# Patient Record
Sex: Female | Born: 1990 | Race: White | Hispanic: No | Marital: Married | Smoking: Never smoker
Health system: Southern US, Community
[De-identification: ages and names within clinical notes are randomized; demographics above are authoritative.]

## PROBLEM LIST (undated history)

## (undated) DIAGNOSIS — M26629 Arthralgia of temporomandibular joint, unspecified side: Secondary | ICD-10-CM

## (undated) DIAGNOSIS — G43109 Migraine with aura, not intractable, without status migrainosus: Secondary | ICD-10-CM

## (undated) DIAGNOSIS — O009 Unspecified ectopic pregnancy without intrauterine pregnancy: Secondary | ICD-10-CM

## (undated) DIAGNOSIS — F32A Depression, unspecified: Secondary | ICD-10-CM

## (undated) DIAGNOSIS — F419 Anxiety disorder, unspecified: Secondary | ICD-10-CM

## (undated) DIAGNOSIS — F329 Major depressive disorder, single episode, unspecified: Secondary | ICD-10-CM

## (undated) HISTORY — DX: Depression, unspecified: F32.A

## (undated) HISTORY — DX: Major depressive disorder, single episode, unspecified: F32.9

## (undated) HISTORY — DX: Anxiety disorder, unspecified: F41.9

## (undated) HISTORY — DX: Arthralgia of temporomandibular joint, unspecified side: M26.629

## (undated) HISTORY — DX: Migraine with aura, not intractable, without status migrainosus: G43.109

## (undated) NOTE — *Deleted (*Deleted)
Pt stated she has been followed in office for decreasing BHCG.  On Monday is raised a little. Went to office yesterday and D&C was done. Repeat BHCG today is elevated and the biopsy of the tissue from [facility] was neg for fetal tissue. Sent to [facility] for MTX injection for probable ectopic pregnancy. Pt reports some discomfort which pat thinks is more likely from procedure yesterday.

---

## 2015-05-20 ENCOUNTER — Ambulatory Visit (INDEPENDENT_AMBULATORY_CARE_PROVIDER_SITE_OTHER): Payer: 59 | Admitting: Family Medicine

## 2015-05-20 ENCOUNTER — Ambulatory Visit (INDEPENDENT_AMBULATORY_CARE_PROVIDER_SITE_OTHER): Payer: 59

## 2015-05-20 VITALS — BP 118/80 | HR 84 | Temp 98.2°F | Resp 20 | Ht 69.0 in | Wt 282.4 lb

## 2015-05-20 DIAGNOSIS — R300 Dysuria: Secondary | ICD-10-CM

## 2015-05-20 DIAGNOSIS — B9689 Other specified bacterial agents as the cause of diseases classified elsewhere: Secondary | ICD-10-CM

## 2015-05-20 DIAGNOSIS — N760 Acute vaginitis: Secondary | ICD-10-CM | POA: Diagnosis not present

## 2015-05-20 DIAGNOSIS — R109 Unspecified abdominal pain: Secondary | ICD-10-CM | POA: Diagnosis not present

## 2015-05-20 DIAGNOSIS — R319 Hematuria, unspecified: Secondary | ICD-10-CM | POA: Diagnosis not present

## 2015-05-20 DIAGNOSIS — A499 Bacterial infection, unspecified: Secondary | ICD-10-CM

## 2015-05-20 DIAGNOSIS — R8290 Unspecified abnormal findings in urine: Secondary | ICD-10-CM | POA: Diagnosis not present

## 2015-05-20 LAB — POCT WET + KOH PREP
Trich by wet prep: ABSENT
Yeast by KOH: ABSENT
Yeast by wet prep: ABSENT

## 2015-05-20 LAB — POC MICROSCOPIC URINALYSIS (UMFC)

## 2015-05-20 LAB — POCT CBC
Granulocyte percent: 57.8 % (ref 37–80)
HCT, POC: 42.9 % (ref 37.7–47.9)
Hemoglobin: 14.6 g/dL (ref 12.2–16.2)
Lymph, poc: 4 — AB (ref 0.6–3.4)
MCH, POC: 27.1 pg (ref 27–31.2)
MCHC: 34.1 g/dL (ref 31.8–35.4)
MCV: 79.3 fL — AB (ref 80–97)
MID (cbc): 0.8 (ref 0–0.9)
MPV: 8.6 fL (ref 0–99.8)
POC Granulocyte: 6.5 (ref 2–6.9)
POC LYMPH PERCENT: 35.5 % (ref 10–50)
POC MID %: 6.7 %M (ref 0–12)
Platelet Count, POC: 272 10*3/uL (ref 142–424)
RBC: 5.41 M/uL (ref 4.04–5.48)
RDW, POC: 13.2 %
WBC: 11.3 10*3/uL — AB (ref 4.6–10.2)

## 2015-05-20 LAB — POCT URINALYSIS DIP (MANUAL ENTRY)
Bilirubin, UA: NEGATIVE
Glucose, UA: NEGATIVE
Leukocytes, UA: NEGATIVE
Nitrite, UA: NEGATIVE
Protein Ur, POC: NEGATIVE
Spec Grav, UA: 1.025
Urobilinogen, UA: 0.2
pH, UA: 5.5

## 2015-05-20 MED ORDER — METRONIDAZOLE 500 MG PO TABS: 500.0000 mg | ORAL_TABLET | Freq: Two times a day (BID) | ORAL | Status: DC

## 2015-05-20 NOTE — Progress Notes (Signed)
 Chief Complaint:  Chief Complaint  Patient presents with  . Urinary Tract Infection    HPI: Samantha Herring is a 24 y.o. female who reports to [facility] today complaining of UTI and is afraid of hsopitalization again for what sounds like pyelonephritis, doe snot feel quite like that Pain and cloudy urine and not feeling well in her bladder and flank pain.  No fevers chills, nausea, vomiting.  No bloody urine. NO vaginal discharge. No kidy stones.  She has paraguard, she got it in August 2014 . The following May she had a UTI. She has not had one since  Past Medical History  Diagnosis Date  . Anxiety   . Depression    History reviewed. No pertinent past surgical history. Social History   Social History  . Marital Status: Single    Spouse Name: N/A  . Number of Children: N/A  . Years of Education: N/A   Social History Main Topics  . Smoking status: Never Smoker   . Smokeless tobacco: None  . Alcohol Use: 1.2 oz/week    2 Standard drinks or equivalent per week  . Drug Use: No  . Sexual Activity: Not Asked   Other Topics Concern  . None   Social History Narrative  . None   Family History  Problem Relation Age of Onset  . Cancer Mother   . Heart disease Maternal Grandmother   . Hyperlipidemia Maternal Grandmother   . Stroke Maternal Grandmother   . Mental retardation Maternal Grandmother   . Cancer Maternal Grandfather   . Heart disease Paternal Grandmother   . Heart disease Paternal Grandfather   . Diabetes Paternal Grandfather    No Known Allergies Prior to Admission medications   Not on File     ROS: The patient denies fevers, chills, night sweats, unintentional weight loss, chest pain, palpitations, wheezing, dyspnea on exertion, nausea, vomiting, hematuria, melena, numbness, weakness, or tingling.   All other systems have been reviewed and were otherwise negative with the exception of those mentioned in the HPI and as above.    PHYSICAL  EXAM: Filed Vitals:   05/20/15 1717  BP: 118/80  Pulse: 84  Temp: 98.2 F (36.8 C)  Resp: 20   Body mass index is 41.68 kg/(m^2).   General: Alert, no acute distress HEENT:  Normocephalic, atraumatic, oropharynx patent. EOMI, PERRLA Cardiovascular:  Regular rate and rhythm, no rubs murmurs or gallops.  No Carotid bruits, radial pulse intact. No pedal edema.  Respiratory: Clear to auscultation bilaterally.  No wheezes, rales, or rhonchi.  No cyanosis, no use of accessory musculature Abdominal: No organomegaly, abdomen is soft and non-tender, positive bowel sounds. No masses. Skin: No rashes. Neurologic: Facial musculature symmetric. Psychiatric: Patient acts appropriately throughout our interaction. Lymphatic: No cervical or submandibular lymphadenopathy Musculoskeletal: Gait intact. No edema, tenderness Neg cmt, + IUD in place, + odor, minimal dc, no pus   LABS: Results for orders placed or performed in visit on 05/20/15  Urine culture  Result Value Ref Range   Colony Count 85,000 COLONIES/ML    Organism ID, Bacteria Multiple bacterial morphotypes present, none    Organism ID, Bacteria predominant. Suggest appropriate recollection if     Organism ID, Bacteria clinically indicated.   COMPLETE METABOLIC PANEL WITH GFR  Result Value Ref Range   Sodium 139 135 - 146 mmol/L   Potassium 3.9 3.5 - 5.3 mmol/L   Chloride 103 98 - 110 mmol/L   CO2 24 20 -  31 mmol/L   Glucose, Bld 86 65 - 99 mg/dL   BUN 10 7 - 25 mg/dL   Creat 1.610.80 0.960.50 - 0.451.10 mg/dL   Total Bilirubin 0.4 0.2 - 1.2 mg/dL   Alkaline Phosphatase 66 33 - 115 U/L   AST 18 10 - 30 U/L   ALT 19 6 - 29 U/L   Total Protein 7.0 6.1 - 8.1 g/dL   Albumin 4.2 3.6 - 5.1 g/dL   Calcium 9.2 8.6 - 40.910.2 mg/dL   GFR, Est African American >89 >=60 mL/min   GFR, Est Non African American >89 >=60 mL/min  POCT urinalysis dipstick  Result Value Ref Range   Color, UA yellow yellow   Clarity, UA hazy (A) clear   Glucose, UA  negative negative   Bilirubin, UA negative negative   Ketones, POC UA trace (5) (A) negative   Spec Grav, UA 1.025    Blood, UA trace-intact (A) negative   pH, UA 5.5    Protein Ur, POC negative negative   Urobilinogen, UA 0.2    Nitrite, UA Negative Negative   Leukocytes, UA Negative Negative  POCT Microscopic Urinalysis ([facility])  Result Value Ref Range   WBC,UR,HPF,POC Few (A) None WBC/hpf   RBC,UR,HPF,POC Few (A) None RBC/hpf   Bacteria Few (A) None, Too numerous to count   Mucus Present (A) Absent   Epithelial Cells, UR Per Microscopy Few (A) None, Too numerous to count cells/hpf  POCT CBC  Result Value Ref Range   WBC 11.3 (A) 4.6 - 10.2 K/uL   Lymph, poc 4.0 (A) 0.6 - 3.4   POC LYMPH PERCENT 35.5 10 - 50 %L   MID (cbc) 0.8 0 - 0.9   POC MID % 6.7 0 - 12 %M   POC Granulocyte 6.5 2 - 6.9   Granulocyte percent 57.8 37 - 80 %G   RBC 5.41 4.04 - 5.48 M/uL   Hemoglobin 14.6 12.2 - 16.2 g/dL   HCT, POC 81.142.9 91.437.7 - 47.9 %   MCV 79.3 (A) 80 - 97 fL   MCH, POC 27.1 27 - 31.2 pg   MCHC 34.1 31.8 - 35.4 g/dL   RDW, POC 78.213.2 %   Platelet Count, POC 272 142 - 424 K/uL   MPV 8.6 0 - 99.8 fL  POCT Wet + KOH Prep  Result Value Ref Range   Yeast by KOH Absent Present, Absent   Yeast by wet prep Absent Present, Absent   WBC by wet prep Moderate (A) None, Few, Too numerous to count   Clue Cells Wet Prep HPF POC Few (A) None, Too numerous to count   Trich by wet prep Absent Present, Absent   Bacteria Wet Prep HPF POC Moderate (A) None, Few, Too numerous to count   Epithelial Cells By [employer] Pref ([facility]) Moderate (A) None, Few, Too numerous to count   RBC,UR,HPF,POC None None RBC/hpf     EKG/XRAY:   Primary read [facility] + IUD Negative for any renal stones    ASSESSMENT/PLAN: Encounter Diagnoses  Name Primary?  . Dysuria   . Hematuria   . Flank pain   . Bacterial vaginosis Yes  . Abnormal urine    Urine cx Rx flagyl Labs pending Decline GC testing Fu  prn   Gross sideeffects, risk and benefits, and alternatives of medications d/w patient. Patient is aware that all medications have potential sideeffects and we are unable to predict every sideeffect or drug-drug interaction that  may occur.    DO  05/23/2015 10:42 AM   Urine culture results neg for anything specific, lm on phone

## 2015-05-20 NOTE — Patient Instructions (Signed)

## 2015-05-21 LAB — COMPLETE METABOLIC PANEL WITHOUT GFR
CO2: 24 mmol/L (ref 20–31)
Chloride: 103 mmol/L (ref 98–110)
GFR, Est African American: >89 mL/min (ref >=60)
GFR, Est Non African American: >89 mL/min (ref >=60)
Total Bilirubin: 0.4 mg/dL (ref 0.2–1.2)
Total Protein: 7 g/dL (ref 6.1–8.1)

## 2015-05-21 LAB — COMPLETE METABOLIC PANEL WITH GFR
ALT: 19 U/L (ref 6–29)
AST: 18 U/L (ref 10–30)
Albumin: 4.2 g/dL (ref 3.6–5.1)
Alkaline Phosphatase: 66 U/L (ref 33–115)
BUN: 10 mg/dL (ref 7–25)
Calcium: 9.2 mg/dL (ref 8.6–10.2)
Creat: 0.8 mg/dL (ref 0.50–1.10)
Glucose, Bld: 86 mg/dL (ref 65–99)
Potassium: 3.9 mmol/L (ref 3.5–5.3)
Sodium: 139 mmol/L (ref 135–146)

## 2015-05-22 LAB — URINE CULTURE: Colony Count: 85000

## 2016-05-03 ENCOUNTER — Encounter: Payer: Self-pay | Admitting: Family Medicine

## 2016-05-04 ENCOUNTER — Ambulatory Visit (INDEPENDENT_AMBULATORY_CARE_PROVIDER_SITE_OTHER): Payer: BLUE CROSS/BLUE SHIELD | Admitting: Family Medicine

## 2016-05-04 ENCOUNTER — Encounter: Payer: Self-pay | Admitting: Family Medicine

## 2016-05-04 VITALS — BP 118/70 | HR 73 | Wt 287.2 lb

## 2016-05-04 DIAGNOSIS — M26629 Arthralgia of temporomandibular joint, unspecified side: Secondary | ICD-10-CM | POA: Diagnosis not present

## 2016-05-04 DIAGNOSIS — G43109 Migraine with aura, not intractable, without status migrainosus: Secondary | ICD-10-CM

## 2016-05-04 DIAGNOSIS — Z7689 Persons encountering health services in other specified circumstances: Secondary | ICD-10-CM

## 2016-05-04 NOTE — Patient Instructions (Addendum)
Keep track of the triggers and symptoms of your headaches. I would like your medical records from your previous providers and have you follow-up regarding migraine headaches. Schedule for a physical exam and fasting labs at your convenience.

## 2016-05-04 NOTE — Progress Notes (Signed)
   Subjective:    Patient ID: Samantha Herring, female    DOB: Sep 03, 1990, 25 y.o.   MRN: [id number]  HPI Chief Complaint  Patient presents with  . new pt    new pt- get established. mentrusal migraines. no other concerns   She is new to the practice and here to establish care and for complaint of headaches. History of migraines since 6th grade. States they are more frequent recently. TMJ diagnosis in the past- has had oral surgery in the past. States she has a severe overbite. Has a dentist here, wears a nightguard.   States she has seen neurologist in the past, had MRIs.   States migraines are triggered by strong smells, sudden light changes and stress. lately notices this around the beginning of her menstrual cycles. She has been getting one per month. However, she did not have a migraine with her last menstrual cycle.  Describes aura as tunnel vision 20-30 minutes prior to headache onset. states she takes 4 Ibuprofen and lays down and it usually goes away. Occasionally has nausea. Photosensitivity but no phonophobia. Migraine lasts 1-2 days at times.   Last migraine was about a month ago.   Previous medical care: [place]  2015 Well [facility].   Last CPE: years   Other providers: sees therapist - generalized anxiety disorder.   Past medical history: UTI in 2015.   Surgeries: sister had a stroke at age 27 due to birth control. Mother with lymphoma but doing fine. HTN, cholesterol in mother and MGF and MGP.  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   Last Gynecological Exam: 2015. Last Menstrual cycle: 1 week ago. Periods are regular.  Pregnancies: 1- abortion  Reviewed allergies, medications, past medical, surgical, family, and social history.    Review of  Systems Pertinent positives and negatives in the history of present illness.     Objective:   Physical Exam BP 118/70   Pulse 73   Wt 287 lb 3.2 oz (130.3 kg)   LMP 04/29/2016   BMI 42.41 kg/m  Alert and oriented and in no acute distress. Not otherwise examined.       Assessment & Plan:  Migraine with aura and without status migrainosus, not intractable  Encounter to establish care  Arthralgia of temporomandibular joint, unspecified laterality  Discussed that she keep a headache diary and look for triggers and symptoms with her migraines and follow-up. She will sign a medical release form and I will get records from her previous neurologist and PCP. In the meantime, I recommend that she try taking 2 Aleve at onset of headache and let me know if this is not helping. May need to consider possible triptan therapy. She is not a candidate for hormonal therapy due to family history of stroke. Recommend she schedule for CPE and fasting labs in the near future. She will continue seeing her dentist for issues related to TMJ.

## 2016-05-10 ENCOUNTER — Ambulatory Visit (INDEPENDENT_AMBULATORY_CARE_PROVIDER_SITE_OTHER): Payer: BLUE CROSS/BLUE SHIELD | Admitting: Family Medicine

## 2016-05-10 ENCOUNTER — Other Ambulatory Visit (HOSPITAL_COMMUNITY)
Admission: RE | Admit: 2016-05-10 | Discharge: 2016-05-10 | Disposition: A | Discharge status: Home / Self Care | Payer: BLUE CROSS/BLUE SHIELD | Source: Ambulatory Visit | Attending: Family Medicine | Admitting: Family Medicine

## 2016-05-10 ENCOUNTER — Encounter: Payer: Self-pay | Admitting: Family Medicine

## 2016-05-10 VITALS — BP 110/80 | HR 97 | Temp 98.8°F | Resp 16 | Ht 69.0 in | Wt 283.2 lb

## 2016-05-10 DIAGNOSIS — Z113 Encounter for screening for infections with a predominantly sexual mode of transmission: Secondary | ICD-10-CM | POA: Diagnosis not present

## 2016-05-10 DIAGNOSIS — Z01419 Encounter for gynecological examination (general) (routine) without abnormal findings: Secondary | ICD-10-CM | POA: Diagnosis present

## 2016-05-10 DIAGNOSIS — Z0000 Encounter for general adult medical examination without abnormal findings: Secondary | ICD-10-CM | POA: Diagnosis not present

## 2016-05-10 DIAGNOSIS — R319 Hematuria, unspecified: Secondary | ICD-10-CM | POA: Diagnosis not present

## 2016-05-10 DIAGNOSIS — Z124 Encounter for screening for malignant neoplasm of cervix: Secondary | ICD-10-CM | POA: Diagnosis not present

## 2016-05-10 DIAGNOSIS — G43109 Migraine with aura, not intractable, without status migrainosus: Secondary | ICD-10-CM | POA: Insufficient documentation

## 2016-05-10 DIAGNOSIS — M26629 Arthralgia of temporomandibular joint, unspecified side: Secondary | ICD-10-CM | POA: Insufficient documentation

## 2016-05-10 LAB — LIPID PANEL
CHOLESTEROL: 185 mg/dL (ref 125–200)
HDL: 40 mg/dL — ABNORMAL LOW (ref >=46)
LDL Cholesterol: 123 mg/dL (ref <130)
Total CHOL/HDL Ratio: 4.6 Ratio (ref <=5.0)
Triglycerides: 108 mg/dL (ref <150)
VLDL: 22 mg/dL (ref <30)

## 2016-05-10 LAB — POCT URINALYSIS DIPSTICK
Bilirubin, UA: NEGATIVE
Glucose, UA: NEGATIVE
KETONES UA: NEGATIVE
LEUKOCYTES UA: NEGATIVE
NITRITE UA: NEGATIVE
PH UA: 5.5
PROTEIN UA: NEGATIVE
Spec Grav, UA: >=1.03
UROBILINOGEN UA: NEGATIVE

## 2016-05-10 LAB — COMPREHENSIVE METABOLIC PANEL
ALT: 20 U/L (ref 6–29)
AST: 18 U/L (ref 10–30)
Albumin: 4.2 g/dL (ref 3.6–5.1)
Alkaline Phosphatase: 66 U/L (ref 33–115)
BUN: 10 mg/dL (ref 7–25)
CHLORIDE: 107 mmol/L (ref 98–110)
CO2: 18 mmol/L — ABNORMAL LOW (ref 20–31)
Calcium: 9.1 mg/dL (ref 8.6–10.2)
Creat: 0.77 mg/dL (ref 0.50–1.10)
GLUCOSE: 82 mg/dL (ref 65–99)
Potassium: 4.3 mmol/L (ref 3.5–5.3)
SODIUM: 137 mmol/L (ref 135–146)
TOTAL PROTEIN: 7 g/dL (ref 6.1–8.1)
Total Bilirubin: 0.5 mg/dL (ref 0.2–1.2)

## 2016-05-10 LAB — CBC WITH DIFFERENTIAL/PLATELET
BASOS ABS: 86 {cells}/uL (ref 0–200)
Basophils Relative: 1 %
EOS PCT: 1 %
Eosinophils Absolute: 86 cells/uL (ref 15–500)
HCT: 40.4 % (ref 35.0–45.0)
Hemoglobin: 13.7 g/dL (ref 11.7–15.5)
LYMPHS ABS: 1892 {cells}/uL (ref 850–3900)
Lymphocytes Relative: 22 %
MCH: 27.5 pg (ref 27.0–33.0)
MCHC: 33.9 g/dL (ref 32.0–36.0)
MCV: 81 fL (ref 80.0–100.0)
MONOS PCT: 8 %
MPV: 10.5 fL (ref 7.5–12.5)
Monocytes Absolute: 688 cells/uL (ref 200–950)
NEUTROS ABS: 5848 {cells}/uL (ref 1500–7800)
Neutrophils Relative %: 68 %
PLATELETS: 314 10*3/uL (ref 140–400)
RBC: 4.99 MIL/uL (ref 3.80–5.10)
RDW: 13.7 % (ref 11.0–15.0)
WBC: 8.6 10*3/uL (ref 4.0–10.5)

## 2016-05-10 LAB — TSH: TSH: 0.95 m[IU]/L

## 2016-05-10 NOTE — Progress Notes (Signed)
Subjective:    Patient ID: Samantha Herring, female    DOB: April 21, 1991, 25 y.o.   MRN: [id number]  HPI Chief Complaint  Patient presents with  . Annual Exam    reports URI sx. pt needs PAP today. pt is fasting.    She is here for a complete physical exam.  History of migraines and last migraine was about a month ago.   Previous medical care: [place]  2015 Well woman [facility].   Last CPE: years   Other providers: sees therapist - generalized anxiety disorder.   Past medical history: UTI in 2015.   Family Hx: sister had a stroke at age 27 due to birth control. Mother with lymphoma but doing fine. HTN, cholesterol in mother and MGF and MGP.  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   Last Gynecological Exam: 2015. Last Menstrual cycle: 1 week ago. Periods are regular.  Pregnancies: 1- abortion  Diet: nothing particular Excerise: none  Immunizations: Tdap 2013, flu- refuses  Health maintenance:  Mammogram: Never Colonoscopy: never Last Gynecological Exam: last pap smear in 2015 Last Menstrual cycle: 04/29/2016 Pregnancies: 1 Last Dental Exam: twice annually  Last Eye Exam: fall 2016, glasses  Wears seatbelt always, uses sunscreen, smoke detectors in home and functioning, does not text while driving and feels safe in home environment.   Reviewed allergies, medications, past medical, surgical, family, and social history.     Review of Systems Review of Systems Constitutional: -fever, -chills, -sweats, -unexpected weight change,-fatigue ENT: -runny nose, -ear pain, -sore throat Cardiology:  -chest pain, -palpitations, -edema Respiratory: -cough, -shortness of breath, -wheezing Gastroenterology: -abdominal pain, -nausea, -vomiting, -diarrhea,  -constipation  Hematology: -bleeding or bruising problems Musculoskeletal: -arthralgias, -myalgias, -joint swelling, -back pain Ophthalmology: -vision changes Urology: -dysuria, -difficulty urinating, -hematuria, -urinary frequency, -urgency Neurology: -headache, -weakness, -tingling, -numbness       Objective:   Physical Exam BP 110/80   Pulse 97   Temp 98.8 F (37.1 C) (Oral)   Resp 16   Ht 5\' 9"  (1.753 m)   Wt 283 lb 3.2 oz (128.5 kg)   LMP 04/29/2016   SpO2 99%   BMI 41.82 kg/m   General Appearance:    Alert, cooperative, no distress, appears stated age  Head:    Normocephalic, without obvious abnormality, atraumatic  Eyes:    PERRL, conjunctiva/corneas clear, EOM's intact, fundi    benign  Ears:    Normal TM's and external ear canals  Nose:   Nares normal, mucosa normal, no drainage or sinus   tenderness  Throat:   Lips, mucosa, and tongue normal; teeth and gums normal  Neck:   Supple, no lymphadenopathy;  thyroid:  no   enlargement/tenderness/nodules; no carotid   bruit or JVD  Back:    Spine nontender, no curvature, ROM normal, no CVA     tenderness  Lungs:     Clear to auscultation bilaterally without wheezes, rales or     ronchi; respirations unlabored  Chest Wall:    No tenderness or deformity   Heart:    Regular rate and rhythm, S1 and S2 normal, no murmur, rub   or gallop  Breast Exam:    No tenderness, masses, or nipple discharge or inversion.      No axillary  lymphadenopathy  Abdomen:     Soft, non-tender, nondistended, normoactive bowel sounds,    no masses, no hepatosplenomegaly  Genitalia:    Normal external genitalia without lesions.  BUS and vagina normal; cervix without lesions, or cervical motion tenderness. No abnormal vaginal discharge.  Uterus and adnexa not enlarged, nontender, no masses.  Pap performed. Chaperone present. IUD strings visualized   Rectal:    Not performed due to age<40 and no related complaints  Extremities:   No clubbing, cyanosis  or edema  Pulses:   2+ and symmetric all extremities  Skin:   Skin color, texture, turgor normal, no rashes or lesions  Lymph nodes:   Cervical, supraclavicular, and axillary nodes normal  Neurologic:   CNII-XII intact, normal strength, sensation and gait; reflexes 2+ and symmetric throughout          Psych:   Normal mood, affect, hygiene and grooming.     Urinalysis dipstick: 2+ blood, otherwise negative      Assessment & Plan:  Routine general medical examination at a health care facility - Plan: Visual acuity screening, Urinalysis Dipstick, CBC with Differential/Platelet, Comprehensive metabolic panel, TSH, Lipid panel  Screening for cervical cancer - Plan: Cytology - PAP  Screening for STD (sexually transmitted disease) - Plan: RPR, HIV antibody  Morbid obesity (HCC) - Plan: TSH  Hematuria, unspecified type - Plan: Urine Microscopic  Discussed that her BMI places her in the morbidly obese category in the greater risk of developing chronic health conditions such as diabetes, hypertension, etc. encouraged her to use a nap such as my fitness pal to count daily calories and carbohydrates. Also encouraged her to start getting some type of physical activity each day. Overall she appears to be doing well. She does have a moderate amount of blood on her urinalysis dipstick and we'll send for microscopic evaluation. Will follow-up as needed. Pap smear performed. Discussed safety and health maintenance. She is up-to-date on tdap. Refuses flu shot. Follow-up in one year or pending labs.

## 2016-05-10 NOTE — Patient Instructions (Signed)
Return for a flu shot if you change your mind and decide to get this.  We will call you with lab results.    Preventative Care for Adults - Female      MAINTAIN REGULAR HEALTH EXAMS:  A routine yearly physical is a good way to check in with your primary care provider about your health and preventive screening. It is also an opportunity to share updates about your health and any concerns you have, and receive a thorough all-over exam.   Most health insurance companies pay for at least some preventative services.  Check with your health plan for specific coverages.  WHAT PREVENTATIVE SERVICES DO WOMEN NEED?  Adult women should have their weight and blood pressure checked regularly.   Women age 62 and older should have their cholesterol levels checked regularly.  Women should be screened for cervical cancer with a Pap smear and pelvic exam beginning at either age 47, or 3 years after they become sexually activity.    Breast cancer screening generally begins at age 36 with a mammogram and breast exam by your primary care provider.    Beginning at age 66 and continuing to age 62, women should be screened for colorectal cancer.  Certain people may need continued testing until age 89.  Updating vaccinations is part of preventative care.  Vaccinations help protect against diseases such as the flu.  Osteoporosis is a disease in which the bones lose minerals and strength as we age. Women ages 24 and over should discuss this with their caregivers, as should women after menopause who have other risk factors.  Lab tests are generally done as part of preventative care to screen for anemia and blood disorders, to screen for problems with the kidneys and liver, to screen for bladder problems, to check blood sugar, and to check your cholesterol level.  Preventative services generally include counseling about diet, exercise, avoiding tobacco, drugs, excessive alcohol consumption, and sexually transmitted  infections.    GENERAL RECOMMENDATIONS FOR GOOD HEALTH:  Healthy diet:  Eat a variety of foods, including fruit, vegetables, animal or vegetable protein, such as meat, fish, chicken, and eggs, or beans, lentils, tofu, and grains, such as rice.  Drink plenty of water daily.  Decrease saturated fat in the diet, avoid lots of red meat, processed foods, sweets, fast foods, and fried foods.  Exercise:  Aerobic exercise helps maintain good heart health. At least 30-40 minutes of moderate-intensity exercise is recommended. For example, a brisk walk that increases your heart rate and breathing. This should be done on most days of the week.   Find a type of exercise or a variety of exercises that you enjoy so that it becomes a part of your daily life.  Examples are running, walking, swimming, water aerobics, and biking.  For motivation and support, explore group exercise such as aerobic class, spin class, Zumba, Yoga,or  martial arts, etc.    Set exercise goals for yourself, such as a certain weight goal, walk or run in a race such as a 5k walk/run.  Speak to your primary care provider about exercise goals.  Disease prevention:  If you smoke or chew tobacco, find out from your caregiver how to quit. It can literally save your life, no matter how long you have been a tobacco user. If you do not use tobacco, never begin.   Maintain a healthy diet and normal weight. Increased weight leads to problems with blood pressure and diabetes.   The Body Mass  Index or BMI is a way of measuring how much of your body is fat. Having a BMI above 27 increases the risk of heart disease, diabetes, hypertension, stroke and other problems related to obesity. Your caregiver can help determine your BMI and based on it develop an exercise and dietary program to help you achieve or maintain this important measurement at a healthful level.  High blood pressure causes heart and blood vessel problems.  Persistent high blood  pressure should be treated with medicine if weight loss and exercise do not work.   Fat and cholesterol leaves deposits in your arteries that can block them. This causes heart disease and vessel disease elsewhere in your body.  If your cholesterol is found to be high, or if you have heart disease or certain other medical conditions, then you may need to have your cholesterol monitored frequently and be treated with medication.   Ask if you should have a cardiac stress test if your history suggests this. A stress test is a test done on a treadmill that looks for heart disease. This test can find disease prior to there being a problem.  Menopause can be associated with physical symptoms and risks. Hormone replacement therapy is available to decrease these. You should talk to your caregiver about whether starting or continuing to take hormones is right for you.   Osteoporosis is a disease in which the bones lose minerals and strength as we age. This can result in serious bone fractures. Risk of osteoporosis can be identified using a bone density scan. Women ages 2965 and over should discuss this with their caregivers, as should women after menopause who have other risk factors. Ask your caregiver whether you should be taking a calcium supplement and Vitamin D, to reduce the rate of osteoporosis.   Avoid drinking alcohol in excess (more than two drinks per day).  Avoid use of street drugs. Do not share needles with anyone. Ask for professional help if you need assistance or instructions on stopping the use of alcohol, cigarettes, and/or drugs.  Brush your teeth twice a day with fluoride toothpaste, and floss once a day. Good oral hygiene prevents tooth decay and gum disease. The problems can be painful, unattractive, and can cause other health problems. Visit your dentist for a routine oral and dental check up and preventive care every 6-12 months.   Look at your skin regularly.  Use a mirror to look at your  back. Notify your caregivers of changes in moles, especially if there are changes in shapes, colors, a size larger than a pencil eraser, an irregular border, or development of new moles.  Safety:  Use seatbelts 100% of the time, whether driving or as a passenger.  Use safety devices such as hearing protection if you work in environments with loud noise or significant background noise.  Use safety glasses when doing any work that could send debris in to the eyes.  Use a helmet if you ride a bike or motorcycle.  Use appropriate safety gear for contact sports.  Talk to your caregiver about gun safety.  Use sunscreen with a SPF (or skin protection factor) of 15 or greater.  Lighter skinned people are at a greater risk of skin cancer. Don't forget to also wear sunglasses in order to protect your eyes from too much damaging sunlight. Damaging sunlight can accelerate cataract formation.   Practice safe sex. Use condoms. Condoms are used for birth control and to help reduce the spread of sexually  transmitted infections (or STIs).  Some of the STIs are gonorrhea (the clap), chlamydia, syphilis, trichomonas, herpes, HPV (human papilloma virus) and HIV (human immunodeficiency virus) which causes AIDS. The herpes, HIV and HPV are viral illnesses that have no cure. These can result in disability, cancer and death.   Keep carbon monoxide and smoke detectors in your home functioning at all times. Change the batteries every 6 months or use a model that plugs into the wall.   Vaccinations:  Stay up to date with your tetanus shots and other required immunizations. You should have a booster for tetanus every 10 years. Be sure to get your flu shot every year, since 5%-20% of the [place] population comes down with the flu. The flu vaccine changes each year, so being vaccinated once is not enough. Get your shot in the fall, before the flu season peaks.   Other vaccines to consider:  Human Papilloma Virus or HPV causes  cancer of the cervix, and other infections that can be transmitted from person to person. There is a vaccine for HPV, and females should get immunized between the ages of 8611 and 5826. It requires a series of 3 shots.   Pneumococcal vaccine to protect against certain types of pneumonia.  This is normally recommended for adults age 27 or older.  However, adults younger than 27 years old with certain underlying conditions such as diabetes, heart or lung disease should also receive the vaccine.  Shingles vaccine to protect against Varicella Zoster if you are older than age 27, or younger than 27 years old with certain underlying illness.  Hepatitis A vaccine to protect against a form of infection of the liver by a virus acquired from food.  Hepatitis B vaccine to protect against a form of infection of the liver by a virus acquired from blood or body fluids, particularly if you work in health care.  If you plan to travel internationally, check with your local health department for specific vaccination recommendations.  Cancer Screening:  Breast cancer screening is essential to preventive care for women. All women age 27 and older should perform a breast self-exam every month. At age 27 and older, women should have their caregiver complete a breast exam each year. Women at ages 5640 and older should have a mammogram (x-ray film) of the breasts. Your caregiver can discuss how often you need mammograms.    Cervical cancer screening includes taking a Pap smear (sample of cells examined under a microscope) from the cervix (end of the uterus). It also includes testing for HPV (Human Papilloma Virus, which can cause cervical cancer). Screening and a pelvic exam should begin at age 27, or 3 years after a woman becomes sexually active. Screening should occur every year, with a Pap smear but no HPV testing, up to age 27. After age 27, you should have a Pap smear every 3 years with HPV testing, if no HPV was found  previously.   Most routine colon cancer screening begins at the age of 27. On a yearly basis, doctors may provide special easy to use take-home tests to check for hidden blood in the stool. Sigmoidoscopy or colonoscopy can detect the earliest forms of colon cancer and is life saving. These tests use a small camera at the end of a tube to directly examine the colon. Speak to your caregiver about this at age 27, when routine screening begins (and is repeated every 5 years unless early forms of pre-cancerous polyps or small growths are  found).

## 2016-05-11 LAB — HIV ANTIBODY (ROUTINE TESTING W REFLEX): HIV: NONREACTIVE

## 2016-05-11 LAB — RPR

## 2016-05-11 LAB — CYTOLOGY - PAP
Chlamydia: NEGATIVE
Diagnosis: NEGATIVE
Neisseria Gonorrhea: NEGATIVE

## 2016-05-11 LAB — URINALYSIS, MICROSCOPIC ONLY
BACTERIA UA: NONE SEEN [HPF]
CASTS: NONE SEEN [LPF]
Crystals: NONE SEEN [HPF]
Yeast: NONE SEEN [HPF]

## 2016-10-06 IMAGING — CR DG ABDOMEN ACUTE W/ 1V CHEST
4 series · 4 of 4 positions shown · non-contrast
Comparison: None.

CLINICAL DATA: Flank region pain and dysuria

EXAM:
DG ABDOMEN ACUTE W/ 1V CHEST

[PA]
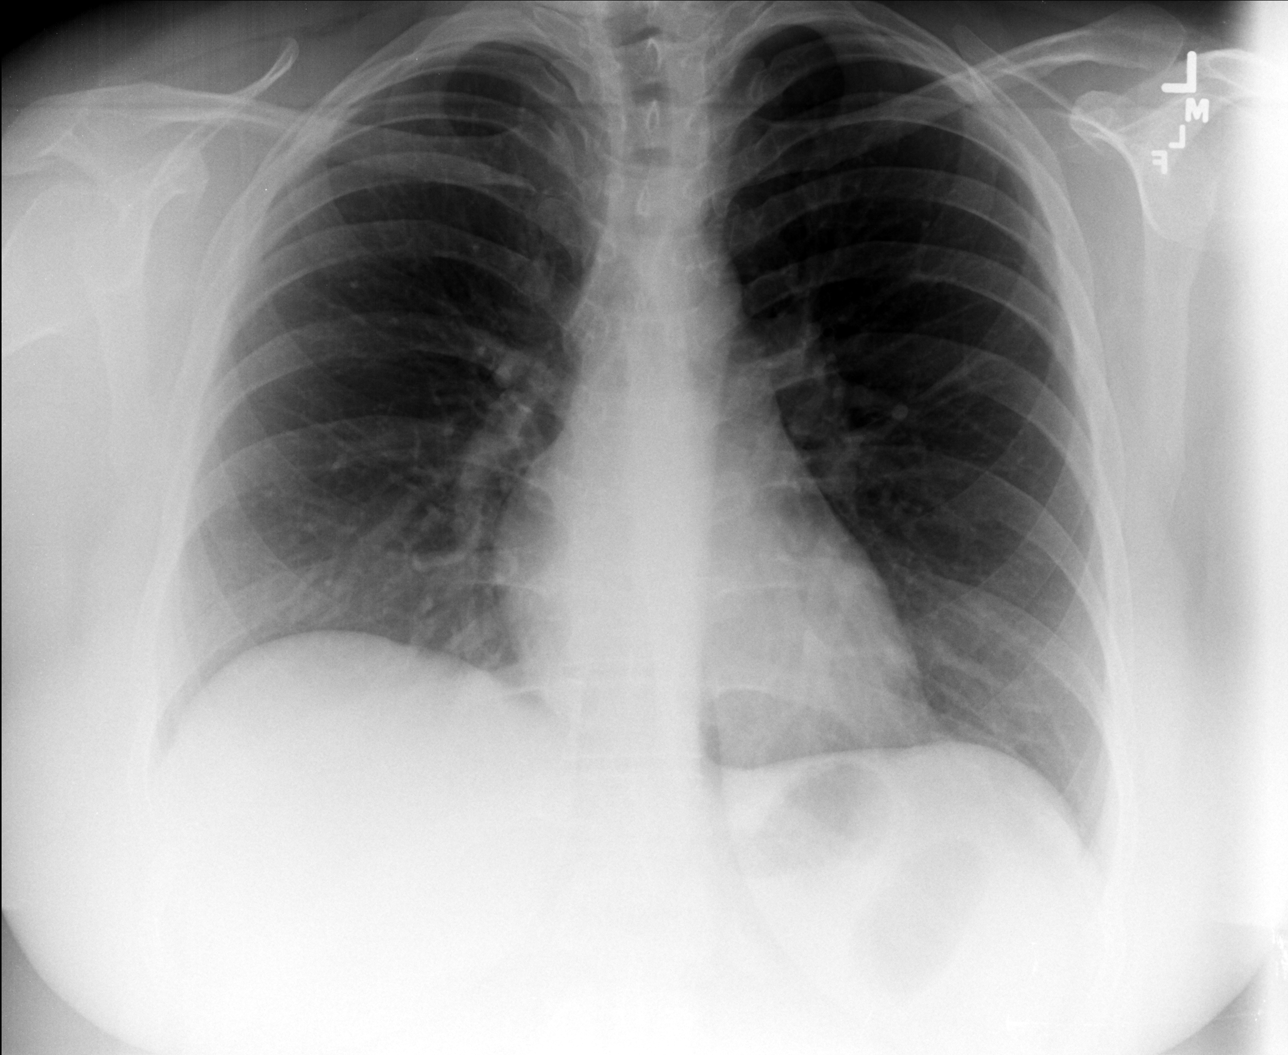

[AP (1 of 3)]
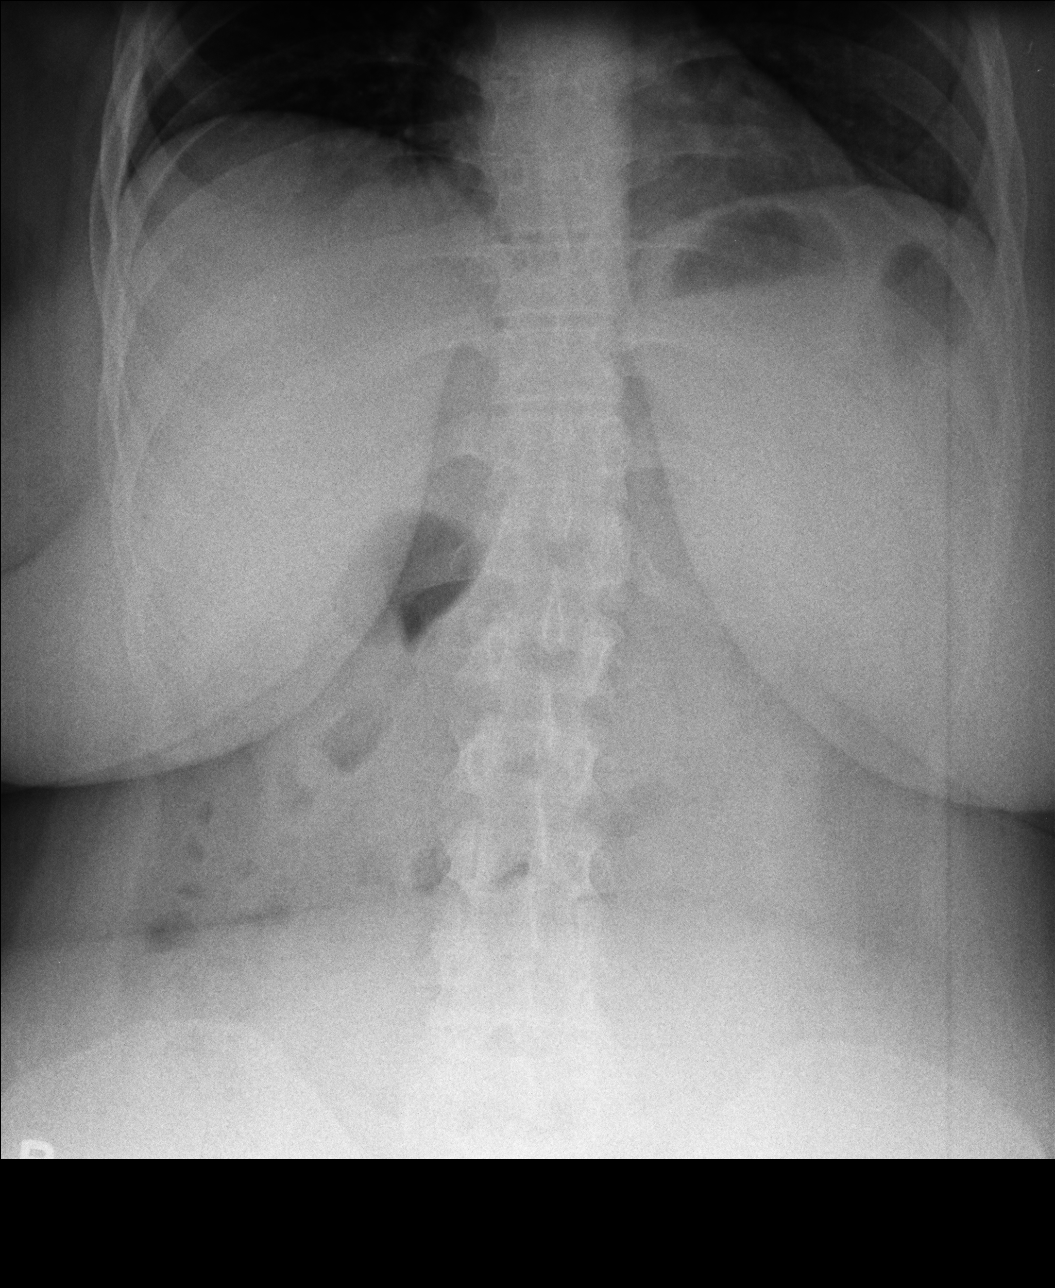

[AP (2 of 3)]
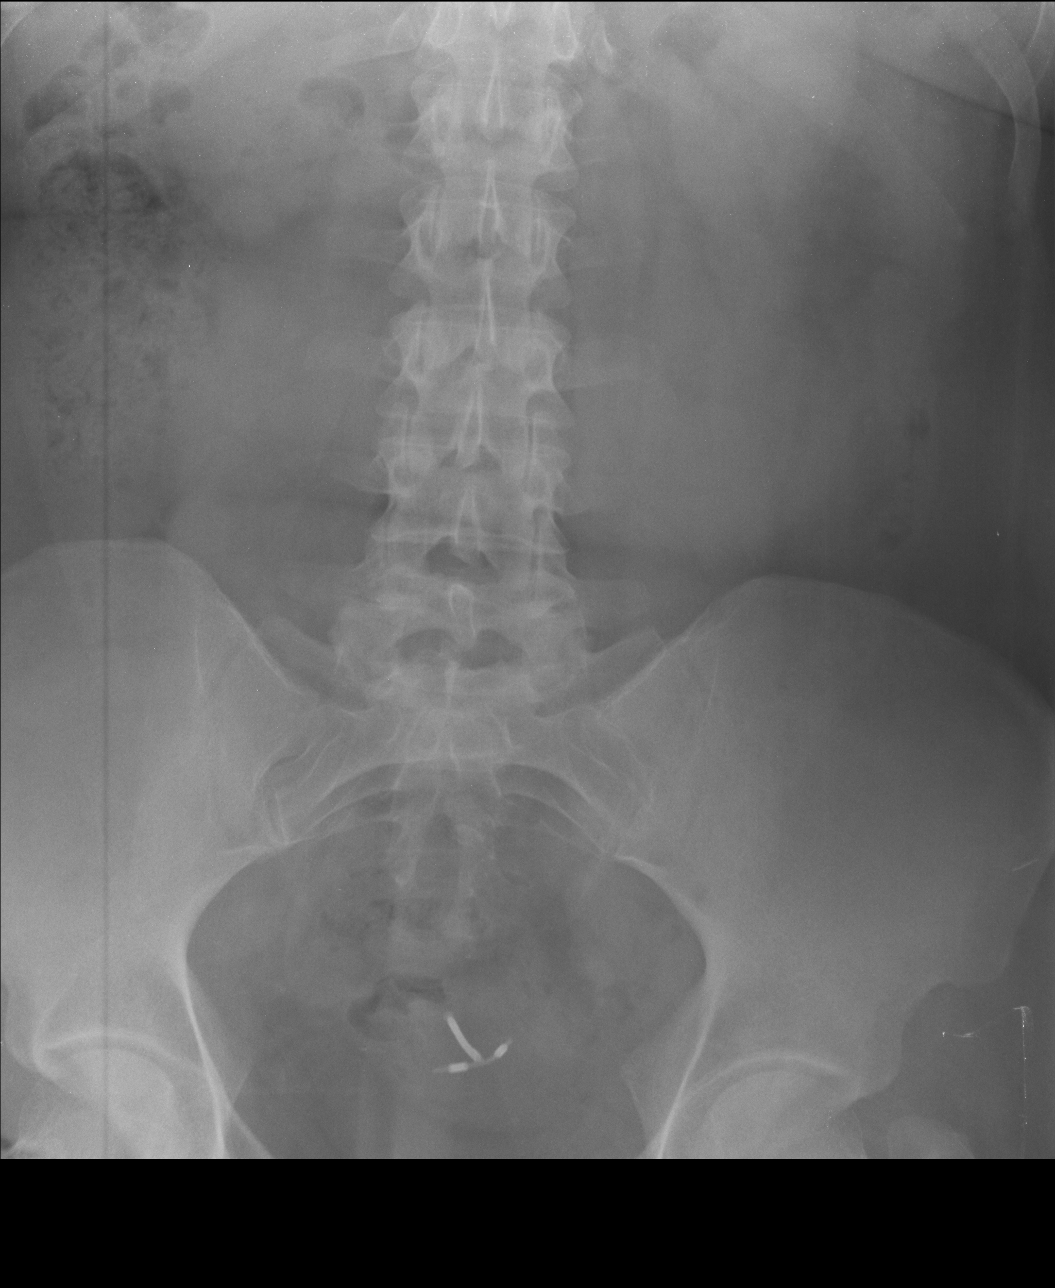

[AP (3 of 3)]
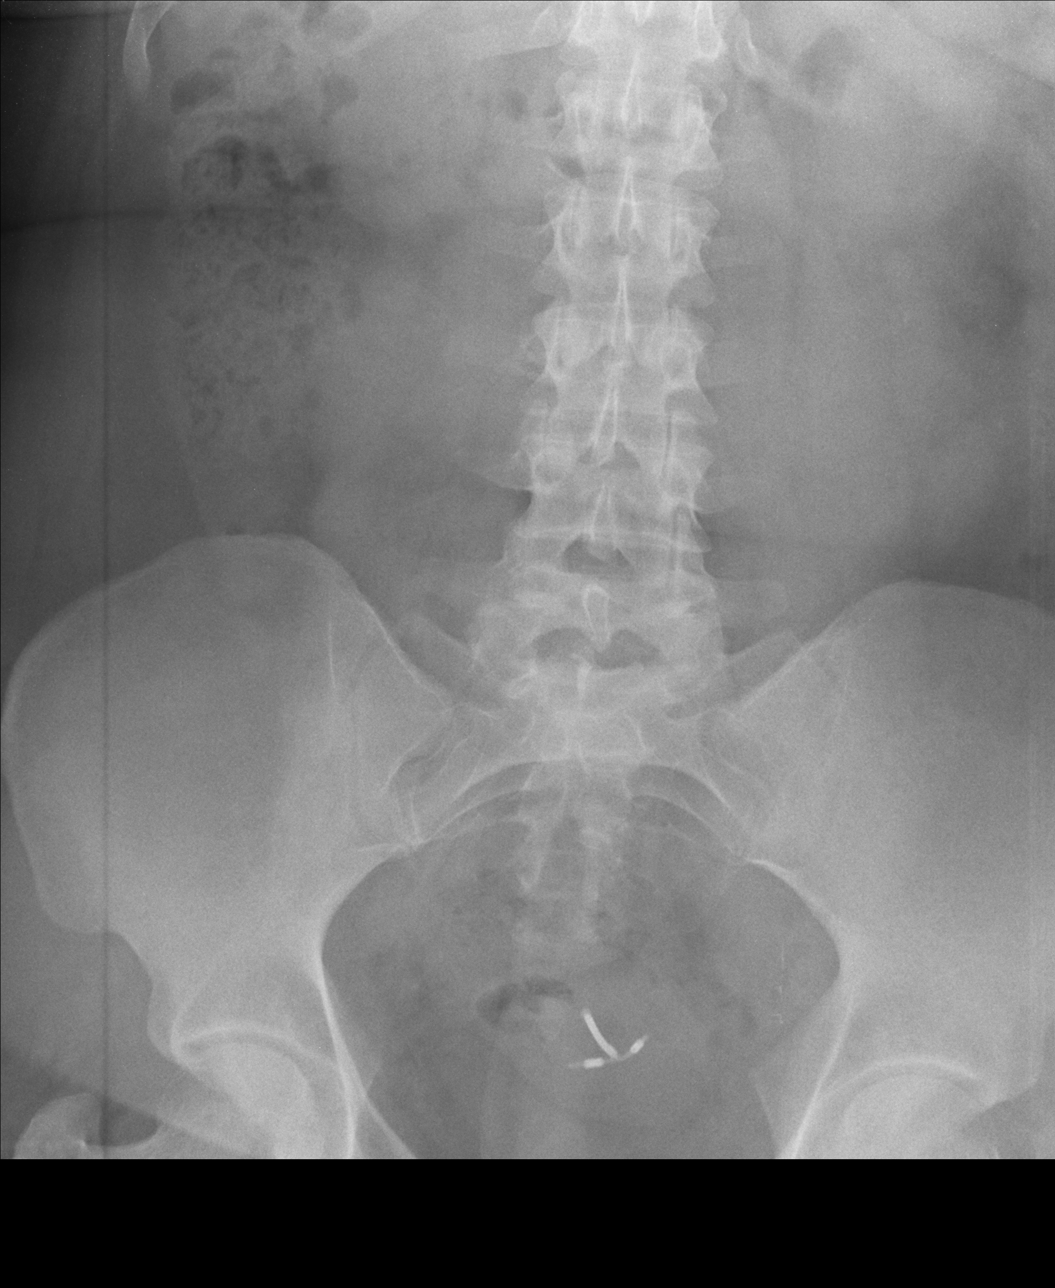

[4 of 4 positions shown; findings below may reference images not displayed]

FINDINGS: PA chest: Lungs are clear. Heart size and pulmonary vascularity are
normal. No adenopathy. No bone lesions.

Supine and upright abdomen: There is moderate stool in the colon.
There is no bowel dilatation or air-fluid level suggesting
obstruction. No free air. No abnormal calcifications are identified.
There is an intrauterine device in the mid-pelvis.
IMPRESSION: Bowel gas pattern unremarkable. No abnormal calcifications.
Intrauterine device in mid pelvis. Lungs clear.

## 2017-04-13 ENCOUNTER — Ambulatory Visit (INDEPENDENT_AMBULATORY_CARE_PROVIDER_SITE_OTHER): Payer: BC Managed Care – PPO | Admitting: Licensed Clinical Social Worker

## 2017-04-13 DIAGNOSIS — F411 Generalized anxiety disorder: Secondary | ICD-10-CM

## 2017-04-27 ENCOUNTER — Ambulatory Visit (INDEPENDENT_AMBULATORY_CARE_PROVIDER_SITE_OTHER): Payer: BC Managed Care – PPO | Admitting: Licensed Clinical Social Worker

## 2017-04-27 ENCOUNTER — Ambulatory Visit: Payer: BC Managed Care – PPO | Admitting: Licensed Clinical Social Worker

## 2017-04-27 DIAGNOSIS — F411 Generalized anxiety disorder: Secondary | ICD-10-CM | POA: Diagnosis not present

## 2017-05-11 ENCOUNTER — Ambulatory Visit (INDEPENDENT_AMBULATORY_CARE_PROVIDER_SITE_OTHER): Payer: BC Managed Care – PPO | Admitting: Licensed Clinical Social Worker

## 2017-05-11 DIAGNOSIS — F411 Generalized anxiety disorder: Secondary | ICD-10-CM

## 2017-05-24 ENCOUNTER — Ambulatory Visit: Payer: BC Managed Care – PPO | Admitting: Licensed Clinical Social Worker

## 2017-06-28 ENCOUNTER — Ambulatory Visit (INDEPENDENT_AMBULATORY_CARE_PROVIDER_SITE_OTHER): Payer: BC Managed Care – PPO | Admitting: Licensed Clinical Social Worker

## 2017-06-28 DIAGNOSIS — F411 Generalized anxiety disorder: Secondary | ICD-10-CM | POA: Diagnosis not present

## 2017-07-31 ENCOUNTER — Ambulatory Visit (INDEPENDENT_AMBULATORY_CARE_PROVIDER_SITE_OTHER): Payer: BC Managed Care – PPO | Admitting: Licensed Clinical Social Worker

## 2017-07-31 DIAGNOSIS — F411 Generalized anxiety disorder: Secondary | ICD-10-CM | POA: Diagnosis not present

## 2017-08-31 ENCOUNTER — Encounter: Payer: Self-pay | Admitting: Family Medicine

## 2017-08-31 ENCOUNTER — Ambulatory Visit: Payer: BC Managed Care – PPO | Admitting: Family Medicine

## 2017-08-31 VITALS — BP 120/80 | HR 78 | Temp 98.5°F | Wt 294.8 lb

## 2017-08-31 DIAGNOSIS — K122 Cellulitis and abscess of mouth: Secondary | ICD-10-CM | POA: Diagnosis not present

## 2017-08-31 MED ORDER — CLARITHROMYCIN 500 MG PO TABS
500.0000 mg | ORAL_TABLET | Freq: Two times a day (BID) | ORAL | 0 refills | Status: DC
Start: 2017-08-31 — End: 2017-09-11

## 2017-08-31 MED ORDER — MUPIROCIN 2 % EX OINT: TOPICAL_OINTMENT | CUTANEOUS | 0 refills | Status: DC

## 2017-08-31 NOTE — Progress Notes (Signed)
   Subjective:    Patient ID: Samantha Herring, female    DOB: May 26, 1991, 27 y.o.   MRN: [id number]  HPI Chief Complaint  Patient presents with  . cold sore    cold sore for the last week- dry lips and woke up this morning with white blisters   She is here with complaints of lip dryness, burning and white spots for the past 1-2 weeks. Denies history of cold sores.  No history of HIV, diabetes or autoimmune illnesses.  Denies fever, chills, night sweats, tongue or gum lesions or soreness. No sore throat or difficulty swallowing.   Denies new medications, toothpaste, foods.   States she has tried changing toothbrushes and lip balms.   Has copper IUD.   Reviewed allergies, medications, past medical, surgical, family, and social history.    Review of Systems Pertinent positives and negatives in the history of present illness.     Objective:   Physical Exam BP 120/80   Pulse 78   Temp 98.5 F (36.9 C) (Oral)   Wt 294 lb 12.8 oz (133.7 kg)   BMI 43.53 kg/m   Lips with erythema, small blisters, white ulceration and honey colored scabs to bilateral corners of mouth without cellulitis. Oral exam normal otherwise. No cervical adenopathy.       Assessment & Plan:  Bacterial skin infection of mouth - Plan: clarithromycin (BIAXIN) 500 MG tablet, mupirocin ointment (BACTROBAN) 2 %  She appears to have an infection suspicious for impetigo. Discussed management, prognosis and treatment. She is aware that this can be very contagious. Will treat with oral and topical antibiotics. Follow up next week.

## 2017-08-31 NOTE — Patient Instructions (Signed)
Use the Bactroban twice daily and take the oral antibiotic as prescribed.  Follow up with me next week to ensure healing.    Impetigo, Adult Impetigo is an infection of the skin. It commonly occurs in young children, but it can also occur in adults. The infection causes itchy blisters and sores that produce brownish-yellow fluid. As the fluid dries, it forms a thick, honey-colored crust. These skin changes usually occur on the face but can also affect other areas of the body. Impetigo usually goes away in 7-10 days with treatment. What are the causes? Impetigo is caused by two types of bacteria. It may be caused by staphylococci or streptococci bacteria. These bacteria cause impetigo when they get under the surface of the skin. This often happens after some damage to the skin, such as damage from:  Cuts, scrapes, or scratches.  Insect bites, especially when you scratch the area of a bite.  Chickenpox or other illnesses that cause open skin sores.  Nail biting or chewing.  Impetigo is contagious and can spread easily from one person to another. This may occur through close skin contact or by sharing towels, clothing, or other items with a person who has the infection. What increases the risk? Some things that can increase the risk of getting this infection include:  Playing sports that include skin-to-skin contact with others.  Having a skin condition with open sores.  Having many skin cuts or scrapes.  Living in an area that has high humidity levels.  Having poor hygiene.  Having high levels of staphylococci in your nose.  What are the signs or symptoms? Impetigo usually starts out as small blisters, often on the face. The blisters then break open and turn into tiny sores (lesions) with a yellow crust. In some cases, the blisters cause itching or burning. With scratching, irritation, or lack of treatment, these small lesions may get larger. Scratching can also cause impetigo to  spread to other parts of the body. The bacteria can get under the fingernails and spread when you touch another area of your skin. Other possible symptoms include:  Larger blisters.  Pus.  Swollen lymph glands.  How is this diagnosed? This condition is usually diagnosed during a physical exam. A skin sample or sample of fluid from a blister may be taken for lab tests that involve growing bacteria (culture test). This can help confirm the diagnosis or help determine the best treatment. How is this treated? Mild impetigo can be treated with prescription antibiotic cream. Oral antibiotic medicine may be used in more severe cases. Medicines for itching may also be used. Follow these instructions at home:  Take medicines only as directed by your health care provider.  To help prevent impetigo from spreading to other body areas: ? Keep your fingernails short and clean. ? Do not scratch the blisters or sores. ? Cover infected areas, if necessary, to keep from scratching.  Gently wash the infected areas with antibiotic soap and water.  Soak crusted areas in warm, soapy water using antibiotic soap. ? Gently rub the areas to remove crusts. Do not scrub.  Wash your hands often to avoid spreading this infection.  Stay home until you have used an antibiotic cream for 48 hours (2 days) or an oral antibiotic medicine for 24 hours (1 day). You should only return to work and activities with other people if your skin shows significant improvement. How is this prevented? To keep the infection from spreading:  Stay home until you  have used an antibiotic cream for 48 hours or an oral antibiotic for 24 hours.  Wash your hands often.  Do not engage in skin-to-skin contact with other people while you have still have blisters.  Do not share towels, washcloths, or bedding with others while you have the infection.  Contact a health care provider if:  You develop more blisters or sores despite  treatment.  Other family members get sores.  Your skin sores are not improving after 48 hours of treatment.  You have a fever. Get help right away if:  You see spreading redness or swelling of the skin around your sores.  You see red streaks coming from your sores.  You develop a sore throat. This information is not intended to replace advice given to you by your health care provider. Make sure you discuss any questions you have with your health care provider. Document Released: 07/18/2014 Document Revised: 12/03/2015 Document Reviewed: 06/10/2014 Elsevier Interactive Patient Education  2017 [employer].

## 2017-09-11 ENCOUNTER — Ambulatory Visit: Payer: BC Managed Care – PPO | Admitting: Family Medicine

## 2017-09-11 ENCOUNTER — Encounter: Payer: Self-pay | Admitting: Family Medicine

## 2017-09-11 VITALS — BP 120/70 | HR 64 | Temp 97.8°F | Wt 293.3 lb

## 2017-09-11 DIAGNOSIS — L089 Local infection of the skin and subcutaneous tissue, unspecified: Secondary | ICD-10-CM | POA: Diagnosis not present

## 2017-09-11 NOTE — Progress Notes (Signed)
   Subjective:    Patient ID: Samantha Herring, female    DOB: 05/06/1991, 27 y.o.   MRN: [id number]  HPI Chief Complaint  Patient presents with  . 1 week follow-up    1 week follow-up on skin. mouth is much better   She is here for follow-up on skin infection to her lips that was presumed to impetigo.  She has completed her oral antibiotic and has also been using Bactroban as prescribed.  States the area has completely cleared up and she is back to her baseline.  No other concerns or complaints.   Review of Systems Denies fever, chills, dizziness, chest pain, palpitations, shortness of breath, abdominal pain, N/V/D, urinary symptoms, LE edema.      Objective:   Physical Exam BP 120/70   Pulse 64   Temp 97.8 F (36.6 C) (Oral)   Wt 293 lb 4.8 oz (133 kg)   BMI 43.31 kg/m   Normal oral exam.  Normal facial exam.      Assessment & Plan:  Skin infection  Skin infection has cleared.  No further treatment needed.  Advised her to stop using the Bactroban.

## 2017-09-12 ENCOUNTER — Ambulatory Visit (INDEPENDENT_AMBULATORY_CARE_PROVIDER_SITE_OTHER): Payer: BC Managed Care – PPO | Admitting: Licensed Clinical Social Worker

## 2017-09-12 DIAGNOSIS — F411 Generalized anxiety disorder: Secondary | ICD-10-CM | POA: Diagnosis not present

## 2017-10-05 ENCOUNTER — Ambulatory Visit: Payer: BC Managed Care – PPO | Admitting: Family Medicine

## 2017-10-26 ENCOUNTER — Ambulatory Visit (INDEPENDENT_AMBULATORY_CARE_PROVIDER_SITE_OTHER): Payer: BC Managed Care – PPO | Admitting: Licensed Clinical Social Worker

## 2017-10-26 DIAGNOSIS — F411 Generalized anxiety disorder: Secondary | ICD-10-CM | POA: Diagnosis not present

## 2017-11-03 ENCOUNTER — Encounter: Payer: Self-pay | Admitting: Family Medicine

## 2017-11-03 ENCOUNTER — Ambulatory Visit: Payer: BC Managed Care – PPO | Admitting: Family Medicine

## 2017-11-03 VITALS — BP 136/84 | HR 78 | Temp 97.9°F | Ht 69.0 in | Wt 294.6 lb

## 2017-11-03 DIAGNOSIS — N3001 Acute cystitis with hematuria: Secondary | ICD-10-CM | POA: Diagnosis not present

## 2017-11-03 LAB — POCT URINALYSIS DIP (PROADVANTAGE DEVICE)
Bilirubin, UA: NEGATIVE
Glucose, UA: NEGATIVE mg/dL
Ketones, POC UA: NEGATIVE mg/dL
NITRITE UA: NEGATIVE
Protein Ur, POC: NEGATIVE mg/dL
SPECIFIC GRAVITY, URINE: 1.005
UUROB: 3.5
pH, UA: 6 (ref 5.0–8.0)

## 2017-11-03 MED ORDER — SULFAMETHOXAZOLE-TRIMETHOPRIM 800-160 MG PO TABS: 1.0000 | ORAL_TABLET | Freq: Two times a day (BID) | ORAL | 0 refills | Status: DC

## 2017-11-03 NOTE — Progress Notes (Signed)
   Subjective:    Patient ID: Samantha Herring, female    DOB: 1990/08/04, 27 y.o.   MRN: [id number]  HPI She complains of a one-week history of right CVA tenderness that she thinks is made worse with sitting.  She is having no frequency, urgency, dysuria.  She did note that 600 mg of ibuprofen did help.  Her next menstrual cycle should be in 1 week.  Review of Systems     Objective:   Physical Exam Alert and in no distress.  Good motion of her back.  No CVA tenderness.  Urine dipstick was positive for white cells and red blood cells Microscopic did show red blood cells and occasional white cell.      Assessment & Plan:  Acute cystitis with hematuria - Plan: sulfamethoxazole-trimethoprim (BACTRIM DS,SEPTRA DS) 800-160 MG tablet  I will treated for UTI.  Also recommend she use 600 mg of ibuprofen as needed for the pain.  She is to return here in roughly 3 weeks for recheck on her urine as her next menses is probably in 1 week.

## 2017-11-03 NOTE — Addendum Note (Signed)
Addended by: Renelda LomaHENRY, Shagun Wordell on: 11/03/2017 04:06 PM   Modules accepted: Orders

## 2017-11-06 ENCOUNTER — Ambulatory Visit: Payer: BC Managed Care – PPO | Admitting: Family Medicine

## 2017-11-13 ENCOUNTER — Telehealth: Payer: Self-pay

## 2017-11-13 NOTE — Telephone Encounter (Signed)
I recommend trying Meclizine 25 mg OTC.

## 2017-11-13 NOTE — Telephone Encounter (Signed)
Patient is going on a fishing boat and would like RX for seasickness.  [facility] [address]

## 2017-11-13 NOTE — Telephone Encounter (Signed)
Left message for pt to call me back 

## 2017-11-14 NOTE — Telephone Encounter (Signed)
Pt is leaving Friday and will be back on Tuesday. They are flying into a port and then getting on a boat. Requesting scopolamine patch 

## 2017-11-14 NOTE — Telephone Encounter (Signed)
Please send in scopolamine transdermal.  patch. Recommend staying well hydrated and not drinking alcohol if using this patch due to possible side effects.

## 2017-11-15 MED ORDER — SCOPOLAMINE 1 MG/3DAYS TD PT72: 1.0000 | MEDICATED_PATCH | TRANSDERMAL | 0 refills | Status: DC

## 2017-11-15 NOTE — Telephone Encounter (Signed)
Pt's fiancee was notified about med being sent in and staying well hydrated and not drink alcohol

## 2017-11-22 ENCOUNTER — Ambulatory Visit (INDEPENDENT_AMBULATORY_CARE_PROVIDER_SITE_OTHER): Payer: BC Managed Care – PPO | Admitting: Licensed Clinical Social Worker

## 2017-11-22 DIAGNOSIS — F411 Generalized anxiety disorder: Secondary | ICD-10-CM | POA: Diagnosis not present

## 2017-11-24 ENCOUNTER — Encounter: Payer: Self-pay | Admitting: Family Medicine

## 2017-11-24 ENCOUNTER — Ambulatory Visit: Payer: BC Managed Care – PPO | Admitting: Family Medicine

## 2017-11-24 VITALS — BP 120/80 | HR 72 | Temp 98.7°F | Ht 69.0 in | Wt 294.6 lb

## 2017-11-24 DIAGNOSIS — N3001 Acute cystitis with hematuria: Secondary | ICD-10-CM

## 2017-11-24 DIAGNOSIS — Z09 Encounter for follow-up examination after completed treatment for conditions other than malignant neoplasm: Secondary | ICD-10-CM | POA: Diagnosis not present

## 2017-11-24 DIAGNOSIS — M545 Low back pain, unspecified: Secondary | ICD-10-CM

## 2017-11-24 LAB — POCT URINALYSIS DIP (PROADVANTAGE DEVICE)
BILIRUBIN UA: NEGATIVE
BILIRUBIN UA: NEGATIVE mg/dL
GLUCOSE UA: NEGATIVE mg/dL
LEUKOCYTES UA: NEGATIVE
NITRITE UA: NEGATIVE
Protein Ur, POC: NEGATIVE mg/dL
Specific Gravity, Urine: 1.015
Urobilinogen, Ur: NEGATIVE
pH, UA: 6 (ref 5.0–8.0)

## 2017-11-24 LAB — POCT UA - MICROSCOPIC ONLY

## 2017-11-24 NOTE — Addendum Note (Signed)
Addended by: Herminio Commons A on: 11/24/2017 04:01 PM   Modules accepted: Orders

## 2017-11-24 NOTE — Addendum Note (Signed)
Addended by: Herminio Commons A on: 11/24/2017 04:58 PM   Modules accepted: Orders

## 2017-11-24 NOTE — Progress Notes (Signed)
   Subjective:    Patient ID: Samantha Herring, female    DOB: 1991/04/09, 27 y.o.   MRN: [id number]  HPI Chief Complaint  Patient presents with  . 3 week follow-up    3 week follow-up feeling better   She is here to follow-up on recent acute cystitis with hematuria.  She saw Dr. Susann Givens for this.  She was treated with Bactrim and ibuprofen.  At her visit she was also having right low back pain that occasionally radiated around to her right lower quadrant.  States that pain resolved and for the past few days she has had some mild left low back discomfort.  Pain is intermittent.  Nonradiating.  Nothing seems to worsen her pain.  Ibuprofen does help. Denies urinary frequency, urgency, dysuria or odor.   Has a copper IUD.   Denies fever, chills, chest pain, palpitations, shortness of breath, abdominal pain, N/V/D, urinary symptoms, vaginal discharge.   Reviewed allergies, medications, past medical, surgical, family, and social history.    Review of Systems Pertinent positives and negatives in the history of present illness.     Objective:   Physical Exam BP 120/80   Pulse 72   Temp 98.7 F (37.1 C) (Oral)   Ht  (1.753 m)   Wt 294 lb 9.6 oz (133.6 kg)   LMP 11/15/2017 (Exact Date)   BMI 43.50 kg/m   Alert and in no distress. Cardiac exam shows a regular sinus rhythm without murmurs or gallops. Lungs are clear to auscultation.  Abdomen is soft, nondistended, nontender.  No CVA tenderness.  Normal curvature and motion of her back.   Urinalysis dipstick 1+ blood otherwise negative. Urine microscopic shows no appreciative red blood cells.  She does have epithelial and and occasional white blood cell    Assessment & Plan:  Acute left-sided low back pain without sciatica  Acute cystitis with hematuria  Follow up  She does not appear to have a UTI at this point.  Suspect that her intermittent low back discomfort is related to musculoskeletal etiology.  Recommend taking  ibuprofen 600 to 800 mg as needed, using heat and stretching or even massage.  She will let me know if the pain is worsening or becoming more consistent or if any new symptoms arise.

## 2018-01-16 ENCOUNTER — Ambulatory Visit (INDEPENDENT_AMBULATORY_CARE_PROVIDER_SITE_OTHER): Payer: BC Managed Care – PPO | Admitting: Licensed Clinical Social Worker

## 2018-01-16 DIAGNOSIS — F411 Generalized anxiety disorder: Secondary | ICD-10-CM

## 2018-02-28 ENCOUNTER — Ambulatory Visit (INDEPENDENT_AMBULATORY_CARE_PROVIDER_SITE_OTHER): Payer: BC Managed Care – PPO | Admitting: Licensed Clinical Social Worker

## 2018-02-28 DIAGNOSIS — F411 Generalized anxiety disorder: Secondary | ICD-10-CM

## 2018-03-16 ENCOUNTER — Encounter: Payer: Self-pay | Admitting: Family Medicine

## 2018-03-16 ENCOUNTER — Ambulatory Visit: Payer: BC Managed Care – PPO | Admitting: Family Medicine

## 2018-03-16 VITALS — BP 120/78 | HR 73 | Temp 98.1°F | Resp 16 | Wt 294.6 lb

## 2018-03-16 DIAGNOSIS — R300 Dysuria: Secondary | ICD-10-CM

## 2018-03-16 DIAGNOSIS — J069 Acute upper respiratory infection, unspecified: Secondary | ICD-10-CM | POA: Diagnosis not present

## 2018-03-16 DIAGNOSIS — R319 Hematuria, unspecified: Secondary | ICD-10-CM

## 2018-03-16 DIAGNOSIS — R350 Frequency of micturition: Secondary | ICD-10-CM

## 2018-03-16 DIAGNOSIS — Z87448 Personal history of other diseases of urinary system: Secondary | ICD-10-CM | POA: Insufficient documentation

## 2018-03-16 LAB — POCT URINALYSIS DIP (PROADVANTAGE DEVICE)
Bilirubin, UA: NEGATIVE
GLUCOSE UA: NEGATIVE mg/dL
Ketones, POC UA: NEGATIVE mg/dL
LEUKOCYTES UA: NEGATIVE
NITRITE UA: NEGATIVE
PH UA: 6 (ref 5.0–8.0)
Protein Ur, POC: NEGATIVE mg/dL
Specific Gravity, Urine: 1.01
UUROB: NEGATIVE

## 2018-03-16 MED ORDER — SULFAMETHOXAZOLE-TRIMETHOPRIM 800-160 MG PO TABS: 1.0000 | ORAL_TABLET | Freq: Two times a day (BID) | ORAL | 0 refills | Status: DC

## 2018-03-16 NOTE — Progress Notes (Signed)
   Subjective:    Patient ID: Samantha Herring, female    DOB: 1991/05/08, 27 y.o.   MRN: [id number]  HPI Chief Complaint  Patient presents with  . possible UTI    pain when going to bathroom, frequent urination. going on for 2 weeks. congestion, sinus pressure and fluid in ears.    She is here with complaints of a 7 day history of urinary urgency, frequency, dysuria. Symptoms have been worsening. Low grade fever last night with chills.  No history of recurrent UTI.  Last UTI was in April.  States she recently got married and sexual activity has increased.  Also reports decreased intake of water Reports having pyelonephritis in 2015.   Also complains of a 2-3 day history rhinorrhea, sinus pressure, nasal congestion, fatigue, sore throat.   Denies dizziness, headache, ear pain, chest pain, palpitations, shortness of breath, cough, abdominal pain, back pain, vaginal discharge.  LMP: Ended 2 days ago.  Reviewed allergies, medications, past medical, surgical, family, and social history.   Review of Systems Pertinent positives and negatives in the history of present illness.     Objective:   Physical Exam BP 120/78   Pulse 73   Temp 98.1 F (36.7 C) (Oral)   Resp 16   Wt 294 lb 9.6 oz (133.6 kg)   SpO2 98%   BMI 43.50 kg/m   Alert and in no distress. No sinus tenderness. Nares with thick discharge. Tympanic membranes and canals are normal. Pharyngeal area is mildly erythematous without exudate.  Neck is supple without adenopathy or thyromegaly. Cardiac exam shows a regular sinus rhythm without murmurs or gallops. Lungs are clear to auscultation.  No CVA tenderness.  Skin is warm and dry, no pallor  Urinalysis dipstick: Large amount of blood, otherwise negative     Assessment & Plan:  Urinary frequency - Plan: sulfamethoxazole-trimethoprim (BACTRIM DS,SEPTRA DS) 800-160 MG tablet, Urine Culture  Burning with urination - Plan: POCT Urinalysis DIP (Proadvantage Device), Urine  Culture  History of pyelonephritis  Acute URI  Hematuria, unspecified type - Plan: sulfamethoxazole-trimethoprim (BACTRIM DS,SEPTRA DS) 800-160 MG tablet, Urine Culture  I am treating her for a UTI.  Bactrim prescribed.  Urine culture sent. If the urine culture is negative we discussed a referral to urology.  Also discussed a follow-up urinalysis due to hematuria and if this has not resolved she after treatment then we will refer her to urology for persistent hematuria Advised to increase water intake and to urinate after sex. Discussed that URI symptoms appear to be related to a viral etiology. Discussed supportive care and return or call if her symptoms persist or worsen.

## 2018-03-16 NOTE — Patient Instructions (Signed)
  I am treating you for a UTI and sending your urine for culture. We will forward the results.  Increase your water intake.   I suspect your URI symptoms are related to a viral illness and recommend treating your symptoms at this point.  Mucinex DM for congestion and cough, drink extra water, use salt water gargles for throat irritation and Tylenol or Ibuprofen for aches and pains.  Call if you are not improving by days 7-10 of your illness or if you develop fever, wheezing or worsening symptoms.

## 2018-03-17 LAB — URINE CULTURE

## 2018-03-19 ENCOUNTER — Other Ambulatory Visit: Payer: Self-pay | Admitting: Family Medicine

## 2018-03-19 DIAGNOSIS — R350 Frequency of micturition: Secondary | ICD-10-CM

## 2018-03-29 ENCOUNTER — Ambulatory Visit (INDEPENDENT_AMBULATORY_CARE_PROVIDER_SITE_OTHER): Payer: BC Managed Care – PPO | Admitting: Licensed Clinical Social Worker

## 2018-03-29 DIAGNOSIS — F411 Generalized anxiety disorder: Secondary | ICD-10-CM | POA: Diagnosis not present

## 2018-05-24 ENCOUNTER — Ambulatory Visit (INDEPENDENT_AMBULATORY_CARE_PROVIDER_SITE_OTHER): Payer: BC Managed Care – PPO | Admitting: Licensed Clinical Social Worker

## 2018-05-24 DIAGNOSIS — F411 Generalized anxiety disorder: Secondary | ICD-10-CM

## 2018-08-09 ENCOUNTER — Ambulatory Visit (INDEPENDENT_AMBULATORY_CARE_PROVIDER_SITE_OTHER): Payer: BC Managed Care – PPO | Admitting: Licensed Clinical Social Worker

## 2018-08-09 DIAGNOSIS — F411 Generalized anxiety disorder: Secondary | ICD-10-CM | POA: Diagnosis not present

## 2018-08-30 ENCOUNTER — Ambulatory Visit (INDEPENDENT_AMBULATORY_CARE_PROVIDER_SITE_OTHER): Payer: BC Managed Care – PPO | Admitting: Licensed Clinical Social Worker

## 2018-08-30 DIAGNOSIS — F411 Generalized anxiety disorder: Secondary | ICD-10-CM | POA: Diagnosis not present

## 2018-09-04 ENCOUNTER — Ambulatory Visit: Payer: BC Managed Care – PPO | Admitting: Licensed Clinical Social Worker

## 2018-09-26 ENCOUNTER — Ambulatory Visit (INDEPENDENT_AMBULATORY_CARE_PROVIDER_SITE_OTHER): Payer: BC Managed Care – PPO | Admitting: Licensed Clinical Social Worker

## 2018-09-26 DIAGNOSIS — F411 Generalized anxiety disorder: Secondary | ICD-10-CM

## 2018-11-07 ENCOUNTER — Ambulatory Visit (INDEPENDENT_AMBULATORY_CARE_PROVIDER_SITE_OTHER): Payer: BC Managed Care – PPO | Admitting: Licensed Clinical Social Worker

## 2018-11-07 DIAGNOSIS — F411 Generalized anxiety disorder: Secondary | ICD-10-CM | POA: Diagnosis not present

## 2018-12-04 ENCOUNTER — Ambulatory Visit: Payer: BC Managed Care – PPO | Admitting: Licensed Clinical Social Worker

## 2018-12-06 ENCOUNTER — Ambulatory Visit (INDEPENDENT_AMBULATORY_CARE_PROVIDER_SITE_OTHER): Payer: BC Managed Care – PPO | Admitting: Licensed Clinical Social Worker

## 2018-12-06 DIAGNOSIS — F411 Generalized anxiety disorder: Secondary | ICD-10-CM | POA: Diagnosis not present

## 2019-01-01 ENCOUNTER — Ambulatory Visit: Payer: Self-pay | Admitting: Licensed Clinical Social Worker

## 2019-01-09 ENCOUNTER — Ambulatory Visit (INDEPENDENT_AMBULATORY_CARE_PROVIDER_SITE_OTHER): Payer: BC Managed Care – PPO | Admitting: Licensed Clinical Social Worker

## 2019-01-09 DIAGNOSIS — F411 Generalized anxiety disorder: Secondary | ICD-10-CM

## 2019-02-06 ENCOUNTER — Ambulatory Visit (INDEPENDENT_AMBULATORY_CARE_PROVIDER_SITE_OTHER): Payer: BC Managed Care – PPO | Admitting: Licensed Clinical Social Worker

## 2019-02-06 DIAGNOSIS — F411 Generalized anxiety disorder: Secondary | ICD-10-CM

## 2019-02-11 ENCOUNTER — Other Ambulatory Visit: Payer: Self-pay

## 2019-02-11 DIAGNOSIS — Z20822 Contact with and (suspected) exposure to covid-19: Secondary | ICD-10-CM

## 2019-02-12 LAB — NOVEL CORONAVIRUS, NAA: SARS-CoV-2, NAA: NOT DETECTED

## 2019-03-07 ENCOUNTER — Ambulatory Visit (INDEPENDENT_AMBULATORY_CARE_PROVIDER_SITE_OTHER): Payer: BC Managed Care – PPO | Admitting: Licensed Clinical Social Worker

## 2019-03-07 DIAGNOSIS — F411 Generalized anxiety disorder: Secondary | ICD-10-CM | POA: Diagnosis not present

## 2019-03-22 ENCOUNTER — Other Ambulatory Visit: Payer: Self-pay

## 2019-03-22 ENCOUNTER — Other Ambulatory Visit (HOSPITAL_COMMUNITY)
Admission: RE | Admit: 2019-03-22 | Discharge: 2019-03-22 | Disposition: A | Discharge status: Home / Self Care | Payer: BC Managed Care – PPO | Source: Ambulatory Visit | Attending: Obstetrics and Gynecology | Admitting: Obstetrics and Gynecology

## 2019-03-22 ENCOUNTER — Ambulatory Visit: Payer: BC Managed Care – PPO | Admitting: Obstetrics and Gynecology

## 2019-03-22 ENCOUNTER — Encounter: Payer: Self-pay | Admitting: Obstetrics and Gynecology

## 2019-03-22 VITALS — BP 124/80 | HR 84 | Temp 97.0°F | Resp 14 | Ht 69.5 in | Wt 299.0 lb

## 2019-03-22 DIAGNOSIS — Z0000 Encounter for general adult medical examination without abnormal findings: Secondary | ICD-10-CM

## 2019-03-22 DIAGNOSIS — Z01419 Encounter for gynecological examination (general) (routine) without abnormal findings: Secondary | ICD-10-CM | POA: Diagnosis not present

## 2019-03-22 DIAGNOSIS — Z124 Encounter for screening for malignant neoplasm of cervix: Secondary | ICD-10-CM

## 2019-03-22 DIAGNOSIS — Z6841 Body Mass Index (BMI) 40.0 and over, adult: Secondary | ICD-10-CM

## 2019-03-22 DIAGNOSIS — Z3169 Encounter for other general counseling and advice on procreation: Secondary | ICD-10-CM

## 2019-03-22 NOTE — Patient Instructions (Signed)
EXERCISE AND DIET:  We recommended that you start or continue a regular exercise program for good health. Regular exercise means any activity that makes your heart beat faster and makes you sweat.  We recommend exercising at least 30 minutes per day at least 3 days a week, preferably 4 or 5.  We also recommend a diet low in fat and sugar.  Inactivity, poor dietary choices and obesity can cause diabetes, heart attack, stroke, and kidney damage, among others.    ALCOHOL AND SMOKING:  Women should limit their alcohol intake to no more than 7 drinks/beers/glasses of wine (combined, not each!) per week. Moderation of alcohol intake to this level decreases your risk of breast cancer and liver damage. And of course, no recreational drugs are part of a healthy lifestyle.  And absolutely no smoking or even second hand smoke. Most people know smoking can cause heart and lung diseases, but did you know it also contributes to weakening of your bones? Aging of your skin?  Yellowing of your teeth and nails?  CALCIUM AND VITAMIN D:  Adequate intake of calcium and Vitamin D are recommended.  The recommendations for exact amounts of these supplements seem to change often, but generally speaking 1,000 mg of calcium (between diet and supplement) and 800 units of Vitamin D per day seems prudent. Certain women may benefit from higher intake of Vitamin D.  If you are among these women, your doctor will have told you during your visit.    PAP SMEARS:  Pap smears, to check for cervical cancer or precancers,  have traditionally been done yearly, although recent scientific advances have shown that most women can have pap smears less often.  However, every woman still should have a physical exam from her gynecologist every year. It will include a breast check, inspection of the vulva and vagina to check for abnormal growths or skin changes, a visual exam of the cervix, and then an exam to evaluate the size and shape of the uterus and  ovaries.  And after 30 years of age, a rectal exam is indicated to check for rectal cancers. We will also provide age appropriate advice regarding health maintenance, like when you should have certain vaccines, screening for sexually transmitted diseases, bone density testing, colonoscopy, mammograms, etc.   MAMMOGRAMS:  All women over 40 years old should have a yearly mammogram. Many facilities now offer a "3D" mammogram, which may cost around $50 extra out of pocket. If possible,  we recommend you accept the option to have the 3D mammogram performed.  It both reduces the number of women who will be called back for extra views which then turn out to be normal, and it is better than the routine mammogram at detecting truly abnormal areas.    COLON CANCER SCREENING: Now recommend starting at age 45. At this time colonoscopy is not covered for routine screening until 50. There are take home tests that can be done between 45-49.   COLONOSCOPY:  Colonoscopy to screen for colon cancer is recommended for all women at age 50.  We know, you hate the idea of the prep.  We agree, BUT, having colon cancer and not knowing it is worse!!  Colon cancer so often starts as a polyp that can be seen and removed at colonscopy, which can quite literally save your life!  And if your first colonoscopy is normal and you have no family history of colon cancer, most women don't have to have it again for   10 years.  Once every ten years, you can do something that may end up saving your life, right?  We will be happy to help you get it scheduled when you are ready.  Be sure to check your insurance coverage so you understand how much it will cost.  It may be covered as a preventative service at no cost, but you should check your particular policy.      Breast Self-Awareness Breast self-awareness means being familiar with how your breasts look and feel. It involves checking your breasts regularly and reporting any changes to your  health care provider. Practicing breast self-awareness is important. A change in your breasts can be a sign of a serious medical problem. Being familiar with how your breasts look and feel allows you to find any problems early, when treatment is more likely to be successful. All women should practice breast self-awareness, including women who have had breast implants. How to do a breast self-exam One way to learn what is normal for your breasts and whether your breasts are changing is to do a breast self-exam. To do a breast self-exam: Look for Changes  1. Remove all the clothing above your waist. 2. Stand in front of a mirror in a room with good lighting. 3. Put your hands on your hips. 4. Push your hands firmly downward. 5. Compare your breasts in the mirror. Look for differences between them (asymmetry), such as: ? Differences in shape. ? Differences in size. ? Puckers, dips, and bumps in one breast and not the other. 6. Look at each breast for changes in your skin, such as: ? Redness. ? Scaly areas. 7. Look for changes in your nipples, such as: ? Discharge. ? Bleeding. ? Dimpling. ? Redness. ? A change in position. Feel for Changes Carefully feel your breasts for lumps and changes. It is best to do this while lying on your back on the floor and again while sitting or standing in the shower or tub with soapy water on your skin. Feel each breast in the following way:  Place the arm on the side of the breast you are examining above your head.  Feel your breast with the other hand.  Start in the nipple area and make  inch (2 cm) overlapping circles to feel your breast. Use the pads of your three middle fingers to do this. Apply light pressure, then medium pressure, then firm pressure. The light pressure will allow you to feel the tissue closest to the skin. The medium pressure will allow you to feel the tissue that is a little deeper. The firm pressure will allow you to feel the tissue  close to the ribs.  Continue the overlapping circles, moving downward over the breast until you feel your ribs below your breast.  Move one finger-width toward the center of the body. Continue to use the  inch (2 cm) overlapping circles to feel your breast as you move slowly up toward your collarbone.  Continue the up and down exam using all three pressures until you reach your armpit.  Write Down What You Find  Write down what is normal for each breast and any changes that you find. Keep a written record with breast changes or normal findings for each breast. By writing this information down, you do not need to depend only on memory for size, tenderness, or location. Write down where you are in your menstrual cycle, if you are still menstruating. If you are having trouble noticing differences   in your breasts, do not get discouraged. With time you will become more familiar with the variations in your breasts and more comfortable with the exam. How often should I examine my breasts? Examine your breasts every month. If you are breastfeeding, the best time to examine your breasts is after a feeding or after using a breast pump. If you menstruate, the best time to examine your breasts is 5-7 days after your period is over. During your period, your breasts are lumpier, and it may be more difficult to notice changes. When should I see my health care provider? See your health care provider if you notice:  A change in shape or size of your breasts or nipples.  A change in the skin of your breast or nipples, such as a reddened or scaly area.  Unusual discharge from your nipples.  A lump or thick area that was not there before.  Pain in your breasts.  Anything that concerns you. pre Eating Plan for Pregnant Women While you are pregnant, your body requires additional nutrition to help support your growing baby. You also have a higher need for some vitamins and minerals, such as folic acid,  calcium, iron, and vitamin D. Eating a healthy, well-balanced diet is very important for your health and your baby's health. Your need for extra calories varies for the three 37-month segments of your pregnancy (trimesters). For most women, it is recommended to consume:  150 extra calories a day during the first trimester.  300 extra calories a day during the second trimester.  300 extra calories a day during the third trimester. What are tips for following this plan?   Do not try to lose weight or go on a diet during pregnancy.  Limit your overall intake of foods that have "empty calories." These are foods that have little nutritional value, such as sweets, desserts, candies, and sugar-sweetened beverages.  Eat a variety of foods (especially fruits and vegetables) to get a full range of vitamins and minerals.  Take a prenatal vitamin to help meet your additional vitamin and mineral needs during pregnancy, specifically for folic acid, iron, calcium, and vitamin D.  Remember to stay active. Ask your health care provider what types of exercise and activities are safe for you.  Practice good food safety and cleanliness. Wash your hands before you eat and after you prepare raw meat. Wash all fruits and vegetables well before peeling or eating. Taking these actions can help to prevent food-borne illnesses that can be very dangerous to your baby, such as listeriosis. Ask your health care provider for more information about listeriosis. What does 150 extra calories look like? Healthy options that provide 150 extra calories each day could be any of the following:  6-8 oz (170-230 g) of plain low-fat yogurt with  cup of berries.  1 apple with 2 teaspoons (11 g) of peanut butter.  Cut-up vegetables with  cup (60 g) of hummus.  8 oz (230 mL) or 1 cup of low-fat chocolate milk.  1 stick of string cheese with 1 medium orange.  1 peanut butter and jelly sandwich that is made with one slice of  whole-wheat bread and 1 tsp (5 g) of peanut butter. For 300 extra calories, you could eat two of those healthy options each day. What is a healthy amount of weight to gain? The right amount of weight gain for you is based on your BMI before you became pregnant. If your BMI:  Was less than 18 (  underweight), you should gain 28-40 lb (13-18 kg).  Was 18-24.9 (normal), you should gain 25-35 lb (11-16 kg).  Was 25-29.9 (overweight), you should gain 15-25 lb (7-11 kg).  Was 30 or greater (obese), you should gain 11-20 lb (5-9 kg). What if I am having twins or multiples? Generally, if you are carrying twins or multiples:  You may need to eat 300-600 extra calories a day.  The recommended range for total weight gain is 25-54 lb (11-25 kg), depending on your BMI before pregnancy.  Talk with your health care provider to find out about nutritional needs, weight gain, and exercise that is right for you. What foods can I eat?  Grains All grains. Choose whole grains, such as whole-wheat bread, oatmeal, or brown rice. Vegetables All vegetables. Eat a variety of colors and types of vegetables. Remember to wash your vegetables well before peeling or eating. Fruits All fruits. Eat a variety of colors and types of fruit. Remember to wash your fruits well before peeling or eating. Meats and other protein foods Lean meats, including chicken, [place], fish, and lean cuts of beef, veal, or pork. If you eat fish or seafood, choose options that are higher in omega-3 fatty acids and lower in mercury, such as salmon, herring, mussels, trout, sardines, pollock, shrimp, crab, and lobster. Tofu. Tempeh. Beans. Eggs. Peanut butter and other nut butters. Make sure that all meats, poultry, and eggs are cooked to food-safe temperatures or "well-done." Two or more servings of fish are recommended each week in order to get the most benefits from omega-3 fatty acids that are found in seafood. Choose fish that are lower in  mercury. You can find more information online:  PumpkinSearch.com.eewww.fda.gov Dairy Pasteurized milk and milk alternatives (such as almond milk). Pasteurized yogurt and pasteurized cheese. Cottage cheese. Sour cream. Beverages Water. Juices that contain 100% fruit juice or vegetable juice. Caffeine-free teas and decaffeinated coffee. Drinks that contain caffeine are okay to drink, but it is better to avoid caffeine. Keep your total caffeine intake to less than 200 mg each day (which is 12 oz or 355 mL of coffee, tea, or soda) or the limit as told by your health care provider. Fats and oils Fats and oils are okay to include in moderation. Sweets and desserts Sweets and desserts are okay to include in moderation. Seasoning and other foods All pasteurized condiments. The items listed above may not be a complete list of recommended foods and beverages. Contact your dietitian for more options. The items listed above may not be a complete list of foods and beverages [you/your child] can eat. Contact a dietitian for more information. What foods are not recommended? Vegetables Raw (unpasteurized) vegetable juices. Fruits Unpasteurized fruit juices. Meats and other protein foods Lunch meats, bologna, hot dogs, or other deli meats. (If you must eat those meats, reheat them until they are steaming hot.) Refrigerated pat, meat spreads from a meat counter, smoked seafood that is found in the refrigerated section of a store. Raw or undercooked meats, poultry, and eggs. Raw fish, such as sushi or sashimi. Fish that have high mercury content, such as tilefish, shark, swordfish, and king mackerel. To learn more about mercury in fish, talk with your health care provider or look for online resources, such as:  PumpkinSearch.com.eewww.fda.gov Dairy Raw (unpasteurized) milk and any foods that have raw milk in them. Soft cheeses, such as feta, queso blanco, queso fresco, Brie, Camembert cheeses, blue-veined cheeses, and Panela cheese (unless it is  made with pasteurized  milk, which must be stated on the label). Beverages Alcohol. Sugar-sweetened beverages, such as sodas, teas, or energy drinks. Seasoning and other foods Homemade fermented foods and drinks, such as pickles, sauerkraut, or kombucha drinks. (Store-bought pasteurized versions of these are okay.) Salads that are made in a store or deli, such as ham salad, chicken salad, egg salad, tuna salad, and seafood salad. The items listed above may not be a complete list of foods and beverages to avoid. Contact your dietitian for more information. The items listed above may not be a complete list of foods and beverages [you/your child] should avoid. Contact a dietitian for more information. Where to find more information To calculate the number of calories you need based on your height, weight, and activity level, you can use an online calculator such as:  PackageNews.is To calculate how much weight you should gain during pregnancy, you can use an online pregnancy weight gain calculator such as:  http://jones-berg.com/ Summary  While you are pregnant, your body requires additional nutrition to help support your growing baby.  Eat a variety of foods, especially fruits and vegetables to get a full range of vitamins and minerals.  Practice good food safety and cleanliness. Wash your hands before you eat and after you prepare raw meat. Wash all fruits and vegetables well before peeling or eating. Taking these actions can help to prevent food-borne illnesses, such as listeriosis, that can be very dangerous to your baby.  Do not eat raw meat or fish. Do not eat fish that have high mercury content, such as tilefish, shark, swordfish, and king mackerel. Do not eat unpasteurized (raw) dairy.  Take a prenatal vitamin to help meet your additional vitamin and mineral needs during pregnancy, specifically for folic acid, iron, calcium, and vitamin  D. This information is not intended to replace advice given to you by your health care provider. Make sure you discuss any questions you have with your health care provider. Document Released: 04/11/2014 Document Revised: 10/18/2018 Document Reviewed: 03/24/2017 Elsevier Patient Education  2020 [employer].  Preparing for Pregnancy If you are considering becoming pregnant, make an appointment to see your regular health care provider to learn how to prepare for a safe and healthy pregnancy (preconception care). During a preconception care visit, your health care provider will:  Do a complete physical exam, including a Pap test.  Take a complete medical history.  Give you information, answer your questions, and help you resolve problems. Preconception checklist Medical history  Tell your health care provider about any current or past medical conditions. Your pregnancy or your ability to become pregnant may be affected by chronic conditions, such as diabetes, chronic hypertension, and thyroid problems.  Include your family's medical history as well as your partner's medical history.  Tell your health care provider about any history of STIs (sexually transmitted infections).These can affect your pregnancy. In some cases, they can be passed to your baby. Discuss any concerns that you have about STIs.  If indicated, discuss the benefits of genetic testing. This testing will show whether there are any genetic conditions that may be passed from you or your partner to your baby.  Tell your health care provider about: ? Any problems you have had with conception or pregnancy. ? Any medicines you take. These include vitamins, herbal supplements, and over-the-counter medicines. ? Your history of immunizations. Discuss any vaccinations that you may need. Diet  Ask your health care provider what to include in a healthy diet that has a  balance of nutrients. This is especially important when you are  pregnant or preparing to become pregnant.  Ask your health care provider to help you reach a healthy weight before pregnancy. ? If you are overweight, you may be at higher risk for certain complications, such as high blood pressure, diabetes, and preterm birth. ? If you are underweight, you are more likely to have a baby who has a low birth weight. Lifestyle, work, and home  Let your health care provider know: ? About any lifestyle habits that you have, such as alcohol use, drug use, or smoking. ? About recreational activities that may put you at risk during pregnancy, such as downhill skiing and certain exercise programs. ? Tell your health care provider about any international travel, especially any travel to places with an active [place] virus outbreak. ? About harmful substances that you may be exposed to at work or at home. These include chemicals, pesticides, radiation, or even litter boxes. ? If you do not feel safe at home. Mental health  Tell your health care provider about: ? Any history of mental health conditions, including feelings of depression, sadness, or anxiety. ? Any medicines that you take for a mental health condition. These include herbs and supplements. Home instructions to prepare for pregnancy Lifestyle   Eat a balanced diet. This includes fresh fruits and vegetables, whole grains, lean meats, low-fat dairy products, healthy fats, and foods that are high in fiber. Ask to meet with a nutritionist or registered dietitian for assistance with meal planning and goals.  Get regular exercise. Try to be active for at least 30 minutes a day on most days of the week. Ask your health care provider which activities are safe during pregnancy.  Do not use any products that contain nicotine or tobacco, such as cigarettes and e-cigarettes. If you need help quitting, ask your health care provider.  Do not drink alcohol.  Do not take illegal drugs.  Maintain a healthy weight. Ask  your health care provider what weight range is right for you. General instructions  Keep an accurate record of your menstrual periods. This makes it easier for your health care provider to determine your baby's due date.  Begin taking prenatal vitamins and folic acid supplements daily as directed by your health care provider.  Manage any chronic conditions, such as high blood pressure and diabetes, as told by your health care provider. This is important. How do I know that I am pregnant? You may be pregnant if you have been sexually active and you miss your period. Symptoms of early pregnancy include:  Mild cramping.  Very light vaginal bleeding (spotting).  Feeling unusually tired.  Nausea and vomiting (morning sickness). If you have any of these symptoms and you suspect that you might be pregnant, you can take a home pregnancy test. These tests check for a hormone in your urine (human chorionic gonadotropin, or hCG). A woman's body begins to make this hormone during early pregnancy. These tests are very accurate. Wait until at least the first day after you miss your period to take one. If the test shows that you are pregnant (you get a positive result), call your health care provider to make an appointment for prenatal care. What should I do if I become pregnant?      Make an appointment with your health care provider as soon as you suspect you are pregnant.  Do not use any products that contain nicotine, such as cigarettes, chewing tobacco, and  e-cigarettes. If you need help quitting, ask your health care provider.  Do not drink alcoholic beverages. Alcohol is related to a number of birth defects.  Avoid toxic odors and chemicals.  You may continue to have sexual intercourse if it does not cause pain or other problems, such as vaginal bleeding. This information is not intended to replace advice given to you by your health care provider. Make sure you discuss any questions you have  with your health care provider. Document Released: 06/09/2008 Document Revised: 06/29/2017 Document Reviewed: 01/17/2016 Elsevier Patient Education  2020 [employer].

## 2019-03-22 NOTE — Progress Notes (Signed)
28 y.o. G37P0010 Single White or Caucasian Not Hispanic or Latino female here as a new patient for preconception counseling. Patient is also due for annual exam. Last Pap smear done with PCP about 2 years ago per patient. Patient has Paragard IUD that was inserted in 2015. She is planning to try to get pregnant in January.  Sexually active, no pain.  Period Cycle (Days): 28 Period Duration (Days): 4-5 Period Pattern: Regular Menstrual Flow: Moderate Menstrual Control: (Menstrual cup) Menstrual Control Change Freq (Hours): 8 Dysmenorrhea: (!) Moderate Dysmenorrhea Symptoms: Cramping(bloating, migraines)   No family history of chromosomal abnormalities. Husband is healthy.   Patient's last menstrual period was 02/22/2019.          Sexually active: Yes.    The current method of family planning is Paragard IUD.    Exercising: Yes.    stationary bike Smoker:  no  Health Maintenance: Pap:  05/10/16 Negative History of abnormal Pap:  no MMG:  n/a BMD:   n/a Colonoscopy: n/a TDaP:  November 2019 Gardasil: No   reports that she has never smoked. She has never used smokeless tobacco. She reports current alcohol use of about 1.0 standard drinks of alcohol per week. She reports that she does not use drugs. She is an Systems developer at [employer].   Past Medical History:  Diagnosis Date  . Anxiety   . Depression   . Migraine headache with aura    since 6th grade  . TMJ arthralgia   Doing okay from depression/anxiety standpoint. Not on medication currently. See's a therapist one x a month.   History reviewed. No pertinent surgical history.  Current Outpatient Medications  Medication Sig Dispense Refill  . PARAGARD INTRAUTERINE COPPER IU by Intrauterine route.     No current facility-administered medications for this visit.     Family History  Problem Relation Age of Onset  . Cancer Mother        lymphoma  . Hypertension Mother   . Stroke Sister 37        estrogen related  .  Heart disease Maternal Grandmother   . Hyperlipidemia Maternal Grandmother   . Stroke Maternal Grandmother   . Alzheimer's disease Maternal Grandmother   . Cancer Maternal Grandfather   . Multiple myeloma Maternal Grandfather   . Heart disease Paternal Grandmother   . Heart disease Paternal Grandfather   Sister had a stroke at 66 on OCP's, fine now.   Review of Systems  Constitutional: Negative.   HENT: Negative.   Eyes: Negative.   Respiratory: Negative.   Cardiovascular: Negative.   Gastrointestinal: Negative.   Endocrine: Negative.   Genitourinary: Negative.   Musculoskeletal: Negative.   Skin: Negative.   Allergic/Immunologic: Negative.   Neurological: Negative.   Hematological: Negative.   Psychiatric/Behavioral: Negative.     Exam:   BP 124/80 (BP Location: Right Arm, Patient Position: Sitting, Cuff Size: Large)   Pulse 84   Temp (!) 97 F (36.1 C) (Temporal)   Resp 14   Ht 5' 9.5" (1.765 m)   Wt 299 lb (135.6 kg)   LMP 02/22/2019   BMI 43.52 kg/m   Weight change: @WEIGHTCHANGE @ Height:   Height: 5' 9.5" (176.5 cm)  Ht Readings from Last 3 Encounters:  03/22/19 5' 9.5" (1.765 m)  11/24/17 5' 9"  (1.753 m)  11/03/17 5' 9"  (1.753 m)    General appearance: alert, cooperative and appears stated age Head: Normocephalic, without obvious abnormality, atraumatic Neck: no adenopathy, supple, symmetrical, trachea midline and thyroid  normal to inspection and palpation Lungs: clear to auscultation bilaterally Cardiovascular: regular rate and rhythm Breasts: normal appearance, no masses or tenderness Abdomen: soft, non-tender; non distended,  no masses,  no organomegaly Extremities: extremities normal, atraumatic, no cyanosis or edema Skin: Skin color, texture, turgor normal. No rashes or lesions Lymph nodes: Cervical, supraclavicular, and axillary nodes normal. No abnormal inguinal nodes palpated Neurologic: Grossly normal   Pelvic: External genitalia:  no lesions               Urethra:  normal appearing urethra with no masses, tenderness or lesions              Bartholins and Skenes: normal                 Vagina: normal appearing vagina with normal color and discharge, no lesions              Cervix: no lesions and IUD string 4 cm               Bimanual Exam:  Uterus:  exam limited with BMI, no masses or tenderness              Adnexa: no mass, fullness, tenderness               Rectovaginal: Confirms               Anus:  normal sphincter tone, no lesions  Chaperone was present for exam.  A:  Well Woman with normal exam  BMI 43, she is trying to loose weight (down 6 lbs in 2-3 weeks)  IUD check, she will return for removal when ready to get pregnant.   P:   Pap with hpv  Screening labs  Start PNV  She has had chicken pox  Rubella screen  Discussed weight loss  Discussed risks of gestational DM, HTN, LGA with elevated BMI  Discussed good health prior to pregnancy, information given  Discussed breast self exam  Discussed calcium and vit D intake

## 2019-03-23 LAB — COMPREHENSIVE METABOLIC PANEL
ALT: 28 IU/L (ref 0–32)
AST: 23 IU/L (ref 0–40)
Albumin/Globulin Ratio: 1.8 (ref 1.2–2.2)
Albumin: 4.4 g/dL (ref 3.9–5.0)
Alkaline Phosphatase: 87 IU/L (ref 39–117)
BUN/Creatinine Ratio: 15 (ref 9–23)
BUN: 12 mg/dL (ref 6–20)
Bilirubin Total: 0.5 mg/dL (ref 0.0–1.2)
CO2: 21 mmol/L (ref 20–29)
Calcium: 9.2 mg/dL (ref 8.7–10.2)
Chloride: 104 mmol/L (ref 96–106)
Creatinine, Ser: 0.81 mg/dL (ref 0.57–1.00)
GFR calc Af Amer: 113 mL/min/{1.73_m2} (ref >59)
GFR calc non Af Amer: 98 mL/min/{1.73_m2} (ref >59)
Globulin, Total: 2.5 g/dL (ref 1.5–4.5)
Glucose: 77 mg/dL (ref 65–99)
Potassium: 4.4 mmol/L (ref 3.5–5.2)
Sodium: 140 mmol/L (ref 134–144)
Total Protein: 6.9 g/dL (ref 6.0–8.5)

## 2019-03-23 LAB — LIPID PANEL
Chol/HDL Ratio: 4.9 ratio — ABNORMAL HIGH (ref 0.0–4.4)
Cholesterol, Total: 200 mg/dL — ABNORMAL HIGH (ref 100–199)
HDL: 41 mg/dL (ref >39)
LDL Chol Calc (NIH): 129 mg/dL — ABNORMAL HIGH (ref 0–99)
Triglycerides: 170 mg/dL — ABNORMAL HIGH (ref 0–149)
VLDL Cholesterol Cal: 30 mg/dL (ref 5–40)

## 2019-03-23 LAB — CBC
Hematocrit: 45.2 % (ref 34.0–46.6)
Hemoglobin: 14.7 g/dL (ref 11.1–15.9)
MCH: 27.1 pg (ref 26.6–33.0)
MCHC: 32.5 g/dL (ref 31.5–35.7)
MCV: 83 fL (ref 79–97)
Platelets: 318 10*3/uL (ref 150–450)
RBC: 5.43 x10E6/uL — ABNORMAL HIGH (ref 3.77–5.28)
RDW: 13.1 % (ref 11.7–15.4)
WBC: 8.5 10*3/uL (ref 3.4–10.8)

## 2019-03-23 LAB — TSH: TSH: 2.12 u[IU]/mL (ref 0.450–4.500)

## 2019-03-23 LAB — HEMOGLOBIN A1C
Est. average glucose Bld gHb Est-mCnc: 103 mg/dL
Hgb A1c MFr Bld: 5.2 % (ref 4.8–5.6)

## 2019-03-23 LAB — RUBELLA SCREEN: Rubella Antibodies, IGG: 1.51 index (ref >0.99)

## 2019-03-27 LAB — CYTOLOGY - PAP
Diagnosis: NEGATIVE
HPV: NOT DETECTED

## 2019-04-03 ENCOUNTER — Ambulatory Visit (INDEPENDENT_AMBULATORY_CARE_PROVIDER_SITE_OTHER): Payer: BC Managed Care – PPO | Admitting: Licensed Clinical Social Worker

## 2019-04-03 DIAGNOSIS — F411 Generalized anxiety disorder: Secondary | ICD-10-CM | POA: Diagnosis not present

## 2019-05-02 ENCOUNTER — Ambulatory Visit (INDEPENDENT_AMBULATORY_CARE_PROVIDER_SITE_OTHER): Payer: BC Managed Care – PPO | Admitting: Licensed Clinical Social Worker

## 2019-05-02 DIAGNOSIS — F411 Generalized anxiety disorder: Secondary | ICD-10-CM | POA: Diagnosis not present

## 2019-05-28 ENCOUNTER — Other Ambulatory Visit: Payer: Self-pay

## 2019-05-28 DIAGNOSIS — Z20822 Contact with and (suspected) exposure to covid-19: Secondary | ICD-10-CM

## 2019-05-29 ENCOUNTER — Ambulatory Visit (INDEPENDENT_AMBULATORY_CARE_PROVIDER_SITE_OTHER): Payer: BC Managed Care – PPO | Admitting: Licensed Clinical Social Worker

## 2019-05-29 DIAGNOSIS — F411 Generalized anxiety disorder: Secondary | ICD-10-CM | POA: Diagnosis not present

## 2019-05-30 LAB — NOVEL CORONAVIRUS, NAA: SARS-CoV-2, NAA: NOT DETECTED

## 2019-06-27 ENCOUNTER — Ambulatory Visit (INDEPENDENT_AMBULATORY_CARE_PROVIDER_SITE_OTHER): Payer: BC Managed Care – PPO | Admitting: Licensed Clinical Social Worker

## 2019-06-27 DIAGNOSIS — F411 Generalized anxiety disorder: Secondary | ICD-10-CM

## 2019-07-23 ENCOUNTER — Ambulatory Visit (INDEPENDENT_AMBULATORY_CARE_PROVIDER_SITE_OTHER): Payer: BC Managed Care – PPO | Admitting: Licensed Clinical Social Worker

## 2019-07-23 DIAGNOSIS — F411 Generalized anxiety disorder: Secondary | ICD-10-CM

## 2019-08-20 ENCOUNTER — Ambulatory Visit (INDEPENDENT_AMBULATORY_CARE_PROVIDER_SITE_OTHER): Payer: BC Managed Care – PPO | Admitting: Licensed Clinical Social Worker

## 2019-08-20 DIAGNOSIS — F419 Anxiety disorder, unspecified: Secondary | ICD-10-CM | POA: Diagnosis not present

## 2019-08-30 ENCOUNTER — Telehealth: Payer: BC Managed Care – PPO | Admitting: Nurse Practitioner

## 2019-08-30 DIAGNOSIS — N61 Mastitis without abscess: Secondary | ICD-10-CM | POA: Diagnosis not present

## 2019-08-30 MED ORDER — SULFAMETHOXAZOLE-TRIMETHOPRIM 800-160 MG PO TABS: 1.0000 | ORAL_TABLET | Freq: Two times a day (BID) | ORAL | 0 refills | Status: AC

## 2019-08-30 NOTE — Progress Notes (Signed)
E Visit for Cellulitis  We are sorry that you are not feeling well. Here is how we plan to help!  Based on what you shared with me it looks like you have cellulitis.  Cellulitis looks like areas of skin redness, swelling, and warmth; it develops as a result of bacteria entering under the skin. Little red spots and/or bleeding can be seen in skin, and tiny surface sacs containing fluid can occur. Fever can be present. Cellulitis is almost always on one side of a body, and the lower limbs are the most common site of involvement.   I have prescribed:  Bactrim DS 1 tablet by mouth twice a day for 7 days  HOME CARE:  . Take your medications as ordered and take all of them, even if the skin irritation appears to be healing.   GET HELP RIGHT AWAY IF:  . Symptoms that don't begin to go away within 48 hours. . Severe redness persists or worsens . If the area turns color, spreads or swells. . If it blisters and opens, develops yellow-brown crust or bleeds. . You develop a fever or chills. . If the pain increases or becomes unbearable.  . Are unable to keep fluids and food down.  MAKE SURE YOU    Understand these instructions.  Will watch your condition.  Will get help right away if you are not doing well or get worse.  Thank you for choosing an e-visit. Your e-visit answers were reviewed by a board certified advanced clinical practitioner to complete your personal care plan. Depending upon the condition, your plan could have included both over the counter or prescription medications. Please review your pharmacy choice. Make sure the pharmacy is open so you can pick up prescription now. If there is a problem, you may contact your provider through [employer] and have the prescription routed to another pharmacy. Your safety is important to [place]. If you have drug allergies check your prescription carefully.  For the next 24 hours you can use MyChart to ask questions about today's visit,  request a non-urgent call back, or ask for a work or school excuse. You will get an email in the next two days asking about your experience. I hope that your e-visit has been valuable and will speed your recovery.  I have spent at least 5 minutes reviewing and documenting in the patient's chart.

## 2019-09-19 ENCOUNTER — Ambulatory Visit (INDEPENDENT_AMBULATORY_CARE_PROVIDER_SITE_OTHER): Payer: BC Managed Care – PPO | Admitting: Licensed Clinical Social Worker

## 2019-09-19 DIAGNOSIS — F411 Generalized anxiety disorder: Secondary | ICD-10-CM

## 2019-10-03 ENCOUNTER — Telehealth: Payer: Self-pay

## 2019-10-03 DIAGNOSIS — Z30432 Encounter for removal of intrauterine contraceptive device: Secondary | ICD-10-CM

## 2019-10-03 NOTE — Telephone Encounter (Signed)
Patient called for an appointment to remove IUD

## 2019-10-03 NOTE — Telephone Encounter (Signed)
Spoke with patient. Patient has a Paragard IUD placed 2015, request to schedule removal for pregnancy planning. Discussed with Dr. Oscar La during 03/22/19 AEX.   IUD removal scheduled for4/12/21 at 9:30am. Order placed for precert.   Routing to provider for final review. Patient is agreeable to disposition. Will close encounter.  Cc: Webb Silversmith, Soundra Pilon

## 2019-10-09 ENCOUNTER — Telehealth: Payer: Self-pay | Admitting: Obstetrics and Gynecology

## 2019-10-09 NOTE — Telephone Encounter (Signed)
Patient returned my call and I conveyed the benefits. Patient understands/agreeable with the benefits. Patient is aware of the cancellation policy. Appointment scheduled 10/21/19.

## 2019-10-09 NOTE — Telephone Encounter (Signed)
Call placed to convey benefits for iud removal. 

## 2019-10-17 ENCOUNTER — Encounter (HOSPITAL_COMMUNITY): Payer: Self-pay

## 2019-10-17 ENCOUNTER — Ambulatory Visit (HOSPITAL_COMMUNITY)
Admission: EM | Admit: 2019-10-17 | Discharge: 2019-10-17 | Disposition: A | Discharge status: Home / Self Care | Payer: BC Managed Care – PPO | Attending: Family Medicine | Admitting: Family Medicine

## 2019-10-17 ENCOUNTER — Other Ambulatory Visit: Payer: Self-pay

## 2019-10-17 DIAGNOSIS — L0291 Cutaneous abscess, unspecified: Secondary | ICD-10-CM | POA: Diagnosis not present

## 2019-10-17 MED ORDER — SULFAMETHOXAZOLE-TRIMETHOPRIM 800-160 MG PO TABS: 1.0000 | ORAL_TABLET | Freq: Two times a day (BID) | ORAL | 1 refills | Status: AC

## 2019-10-17 NOTE — Discharge Instructions (Addendum)
Resist temptation to press on the area Warm compresses Take 10 days of antibiotic Repeat if needed Cultures will be available on MyChart You will be called with any positive findings

## 2019-10-17 NOTE — ED Provider Notes (Signed)
[facility]    CSN: [id number] Arrival date & time: 10/17/19  1811      History   Chief Complaint Chief Complaint  Patient presents with  . Abscess    HPI Samantha Herring is a 29 y.o. female.   HPI  Patient has a cyst on her right breast.  It comes and goes.  At times it becomes infected.  When it is infected it is more swollen and painful.  She was treated with an antibiotic several months ago.  It swelled and got painful again.  She called today for a video visit.  They recommended a medical evaluation because any perceived lump in the breast needs to be evaluated in person. No breast cancer in family   Past Medical History:  Diagnosis Date  . Anxiety   . Depression   . Migraine headache with aura    since 6th grade  . TMJ arthralgia     Patient Active Problem List   Diagnosis Date Noted  . History of pyelonephritis 03/16/2018  . Morbid obesity ([place]) 05/10/2016  . TMJ arthralgia   . Migraine headache with aura     History reviewed. No pertinent surgical history.  OB History    Gravida  1   Para  0   Term  0   Preterm  0   AB  1   Living  0     SAB  0   TAB  0   Ectopic  0   Multiple  0   Live Births  0            Home Medications    Prior to Admission medications   Medication Sig Start Date End Date Taking? Authorizing Provider  PARAGARD INTRAUTERINE COPPER IU by Intrauterine route.    [provider]  sulfamethoxazole-trimethoprim (BACTRIM DS) 800-160 MG tablet Take 1 tablet by mouth 2 (two) times daily for 10 days. 10/17/19 10/27/19  Raylene Everts, MD    Family History Family History  Problem Relation Age of Onset  . Cancer Mother        lymphoma  . Hypertension Mother   . Stroke Sister 78        estrogen related  . Heart disease Maternal Grandmother   . Hyperlipidemia Maternal Grandmother   . Stroke Maternal Grandmother   . Alzheimer's disease Maternal Grandmother   . Cancer Maternal Grandfather    . Multiple myeloma Maternal Grandfather   . Heart disease Paternal Grandmother   . Heart disease Paternal Grandfather     Social History Social History   Tobacco Use  . Smoking status: Never Smoker  . Smokeless tobacco: Never Used  Substance Use Topics  . Alcohol use: Yes    Alcohol/week: 1.0 standard drinks    Types: 1 Standard drinks or equivalent per week  . Drug use: No     Allergies   Patient has no known allergies.   Review of Systems Review of Systems  Skin: Positive for wound.     Physical Exam Triage Vital Signs ED Triage Vitals  Enc Vitals Group     BP 10/17/19 1842 134/85     Pulse Rate 10/17/19 1842 91     Resp -- 22     Temp 10/17/19 1842 98.4 F (36.9 C)     Temp Source 10/17/19 1842 Oral     SpO2 10/17/19 1842 97 %     Weight --      Height --  Head Circumference --      Peak Flow --      Pain Score 10/17/19 1841 6     Pain Loc --      Pain Edu? --      Excl. in [place]? --    No data found.  Updated Vital Signs BP 134/85 (BP Location: Right Arm)   Pulse 91   Temp 98.4 F (36.9 C) (Oral)   SpO2 97%      Physical Exam Constitutional:      General: She is not in acute distress.    Appearance: She is well-developed.     Comments: overweight  HENT:     Head: Normocephalic and atraumatic.     Mouth/Throat:     Comments: Mask is in place Eyes:     Conjunctiva/sclera: Conjunctivae normal.     Pupils: Pupils are equal, round, and reactive to light.  Cardiovascular:     Rate and Rhythm: Normal rate.  Pulmonary:     Effort: Pulmonary effort is normal. No respiratory distress.  Chest:    Musculoskeletal:        General: Normal range of motion.     Cervical back: Normal range of motion.  Skin:    General: Skin is warm and dry.  Neurological:     Mental Status: She is alert.  Psychiatric:        Mood and Affect: Mood normal.        Behavior: Behavior normal.       UC Treatments / Results  Labs (all labs ordered are  listed, but only abnormal results are displayed) Labs Reviewed  AEROBIC/ANAEROBIC CULTURE (SURGICAL/DEEP WOUND)  AEROBIC CULTURE (SUPERFICIAL SPECIMEN)    EKG   Radiology No results found.  Procedures Procedures (including critical care time)  Medications Ordered in UC Medications - No data to display  Initial Impression / Assessment and Plan / UC Course  I have reviewed the triage vital signs and the nursing notes.  Pertinent labs & imaging results that were available during my care of the patient were reviewed by me and considered in my medical decision making (see chart for details).     Skin structure with recurrent infections, probable cyst Final Clinical Impressions(s) / UC Diagnoses   Final diagnoses:  Abscess     Discharge Instructions     Resist temptation to press on the area Warm compresses Take 10 days of antibiotic Repeat if needed Cultures will be available on MyChart You will be called with any positive findings   ED Prescriptions    Medication Sig Dispense Auth. Provider   sulfamethoxazole-trimethoprim (BACTRIM DS) 800-160 MG tablet Take 1 tablet by mouth 2 (two) times daily for 10 days. 20 tablet Raylene Everts, MD     PDMP not reviewed this encounter.   Raylene Everts, MD 10/17/19 [phone]

## 2019-10-17 NOTE — ED Triage Notes (Signed)
Pt presents with recurrent abscess on right breast for past few days.  Pt was previously treated with a 7 day antibiotic about 2 months ago.

## 2019-10-18 ENCOUNTER — Other Ambulatory Visit: Payer: Self-pay

## 2019-10-21 ENCOUNTER — Encounter: Payer: Self-pay | Admitting: Obstetrics and Gynecology

## 2019-10-21 ENCOUNTER — Ambulatory Visit (INDEPENDENT_AMBULATORY_CARE_PROVIDER_SITE_OTHER): Payer: BC Managed Care – PPO | Admitting: Obstetrics and Gynecology

## 2019-10-21 ENCOUNTER — Other Ambulatory Visit: Payer: Self-pay

## 2019-10-21 VITALS — BP 110/64 | HR 103 | Temp 97.9°F | Ht 69.0 in | Wt 292.0 lb

## 2019-10-21 DIAGNOSIS — Z872 Personal history of diseases of the skin and subcutaneous tissue: Secondary | ICD-10-CM | POA: Diagnosis not present

## 2019-10-21 DIAGNOSIS — Z3169 Encounter for other general counseling and advice on procreation: Secondary | ICD-10-CM

## 2019-10-21 DIAGNOSIS — Z30432 Encounter for removal of intrauterine contraceptive device: Secondary | ICD-10-CM | POA: Diagnosis not present

## 2019-10-21 NOTE — Progress Notes (Addendum)
GYNECOLOGY  VISIT   HPI: 29 y.o.   Significant Other White or Caucasian Not Hispanic or Latino  female   G1P0010 with Patient's last menstrual period was 10/18/2019.   here for IUD removal, wants to get pregnant. Discussed good health prior to conception at her last visit.  She is on PNV, has been fully vaccinated.   Her husband is of [place] descent and she thinks his family history is positive for cystic fibrosis.   She is taking bactrim Ds 800 for a right breast abscess. She had 2 cc of pus drained in Urgent care on 10/17/19. Culture with PMN's, no growth in 2 days. She previously took antibiotics in 2/21 and it came back. Now on a 10 day course of antibiotics. 40% better.     GYNECOLOGIC HISTORY: Patient's last menstrual period was 10/18/2019. Contraception:IUD Menopausal hormone therapy: none         OB History    Gravida  1   Para  0   Term  0   Preterm  0   AB  1   Living  0     SAB  0   TAB  0   Ectopic  0   Multiple  0   Live Births  0              Patient Active Problem List   Diagnosis Date Noted  . History of pyelonephritis 03/16/2018  . Morbid obesity ([place]) 05/10/2016  . TMJ arthralgia   . Migraine headache with aura     Past Medical History:  Diagnosis Date  . Anxiety   . Depression   . Migraine headache with aura    since 6th grade  . TMJ arthralgia     No past surgical history on file.  Current Outpatient Medications  Medication Sig Dispense Refill  . PARAGARD INTRAUTERINE COPPER IU by Intrauterine route.    . sulfamethoxazole-trimethoprim (BACTRIM DS) 800-160 MG tablet Take 1 tablet by mouth 2 (two) times daily for 10 days. 20 tablet 1   No current facility-administered medications for this visit.     ALLERGIES: Patient has no known allergies.  Family History  Problem Relation Age of Onset  . Cancer Mother        lymphoma  . Hypertension Mother   . Stroke Sister 7        estrogen related  . Heart disease  Maternal Grandmother   . Hyperlipidemia Maternal Grandmother   . Stroke Maternal Grandmother   . Alzheimer's disease Maternal Grandmother   . Cancer Maternal Grandfather   . Multiple myeloma Maternal Grandfather   . Heart disease Paternal Grandmother   . Heart disease Paternal Grandfather     Social History   Socioeconomic History  . Marital status: Significant Other    Spouse name: Not on file  . Number of children: Not on file  . Years of education: Not on file  . Highest education level: Not on file  Occupational History  . Not on file  Tobacco Use  . Smoking status: Never Smoker  . Smokeless tobacco: Never Used  Substance and Sexual Activity  . Alcohol use: Yes    Alcohol/week: 1.0 standard drinks    Types: 1 Standard drinks or equivalent per week  . Drug use: No  . Sexual activity: Yes    Birth control/protection: I.U.D.    Comment: Paragard inserted 2015  Other Topics Concern  . Not on file  Social History Narrative  .  Not on file   Social Determinants of Health   Financial Resource Strain:   . Difficulty of Paying Living Expenses:   Food Insecurity:   . Worried About Charity fundraiser in the Last Year:   . Arboriculturist in the Last Year:   Transportation Needs:   . Film/video editor (Medical):   Marland Kitchen Lack of Transportation (Non-Medical):   Physical Activity:   . Days of Exercise per Week:   . Minutes of Exercise per Session:   Stress:   . Feeling of Stress :   Social Connections:   . Frequency of Communication with Friends and Family:   . Frequency of Social Gatherings with Friends and Family:   . Attends Religious Services:   . Active Member of Clubs or Organizations:   . Attends Archivist Meetings:   Marland Kitchen Marital Status:   Intimate Partner Violence:   . Fear of Current or Ex-Partner:   . Emotionally Abused:   Marland Kitchen Physically Abused:   . Sexually Abused:     Review of Systems  All other systems reviewed and are  negative.   PHYSICAL EXAMINATION:    BP 110/64   Pulse (!) 103   Temp 97.9 F (36.6 C)   Ht 5' 9"  (1.753 m)   Wt 292 lb (132.5 kg)   LMP 10/18/2019   SpO2 99%   BMI 43.12 kg/m     General appearance: alert, cooperative and appears stated age Breasts: in the right breast at 5 o'clock is a resolving breast abscess, not actively draining, bruised, no surrounding erythema.   Pelvic: External genitalia:  no lesions              Urethra:  normal appearing urethra with no masses, tenderness or lesions              Bartholins and Skenes: normal                 Vagina: normal appearing vagina with normal color and discharge, no lesions              Cervix: no lesions and IUD string 4 cm. IUD removed with ringed forceps               Chaperone was present for exam.  ASSESSMENT IUD removal  Planning pregnancy Spouse is of Ashkenazi Jewish descent and may have a FH of cystic fibrosis Well healing breast abscess, s/p drainage on on antibiotics.     PLAN IUD removed Given name of genetic counselor, recommend that she and her husband be seen there and get appropriate testing prior to pregnancy Use condoms for now Continue PNV Complete antibiotics, call with concerns   12/09/19 Addendum: The patient and her husband were seen for genetic counseling. 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 Collected on 10/26/19 Results for her husband Gilford Raid (03/09/88) show that he is a carrier for Tay-Sachs disease which is also an autosomal recessive inheritance.

## 2019-10-21 NOTE — Patient Instructions (Signed)
Genetic Testing for Cystic Fibrosis Why am I having this test? Cystic fibrosis (CF) is a disease that affects many parts of the body. It is often considered a lung disease, and breathing (respiratory) failure is the most serious problem related to the condition. Cystic fibrosis is caused by an abnormal (mutated) gene that is passed from parent to child (inherited). Genetic testing can be done to check for this gene. To develop CF, a person must inherit two copies of the mutated gene, one from each parent. When a person only inherits one copy of the mutated gene, the person is called a CF carrier. This person will not have CF but can pass the mutated gene to his or her children. Some groups of people have a higher risk of being a CF carrier, such as people who are Caucasian or of Ashkenazi Jewish descent. Genetic testing may be done for two main reasons:  To find out the risk of having a child with CF or passing the gene mutation to a child.  To check for the disease. Any child or adult with CF symptoms or a family history of CF may be tested. This test is a reliable way of determining whether a person has CF, but the test cannot determine how bad a person's symptoms will be. What is being tested? This test checks for the type of gene mutation that causes CF. The CF gene is called cystic fibrosis transmembrane conductance regulator (CFTR). Many possible mutations can affect the CFTR gene. Genetic testing checks for the 24 most common ones (CF gene mutation panel). What kind of sample is taken?     For adults and older children, a blood sample is typically required for this test. It is usually collected by inserting a needle into a blood vessel. Sometimes the test may be done on cells that are swabbed from the inside of the cheek. For a woman who is having the test during pregnancy, the test requires a sample of either of the following:  The fluid that surrounds the baby in the uterus (amniotic  fluid). The fluid is collected during a procedure called amniocentesis, which can be done after 15 weeks of pregnancy.  Tissue from the placenta. This sample is collected during a procedure called chorionic villus sampling, which can be done between 10 and 13 weeks of pregnancy. For a newborn, a blood sample is collected by doing a heel prick. How do I prepare for this test? There is no preparation needed for this test. How are the results reported? Your test results will be reported as either positive or negative. What do the results mean? A negative test means that it is highly unlikely you have a CFTR mutation. A positive test means that you have a genetic mutation of the CFTR gene. These are the possible results:  If you are positive for two copies of the mutation, you may be diagnosed with CF.  If you are positive for one copy of the mutation, you may be a carrier. Your siblings and your partner should be tested.  If you and your partner test positive as carriers, any child you have will be at risk for CF. If you are pregnant, consider prenatal testing for CF.  If you test negative for CF, but you have CF symptoms, you may need more tests to check for less common mutations.  If you test negative and you have no symptoms, it is likely that you do not have CF and you are not  a carrier. Talk with your health care provider about what your results mean. If you test positive for a CF mutation, you should work with a health care provider who specializes in counseling for genetic disorders (Dentist). Genetic counseling is most important for couples who are CF carriers and want to have children. Questions to ask your health care provider: Ask your health care provider, or the department that is doing the test:  When will my results be ready?  How will I get my results?  What are my treatment options?  What other tests do I need?  What are my next steps? Summary  Cystic  fibrosis (CF) is a disease that affects many different parts of the body. It is caused by an abnormal (mutated) gene that is passed from parent to child (inherited).  When a person only inherits one copy of the mutated gene, the person is called a CF carrier. This person will not have CF but can pass the mutated gene to his or her children.  Some people choose to be tested before having children to find out the risk of having a child with CF or passing the gene mutation to a child.  A person who has CF symptoms or a family history of CF may be tested to check for the disease.  Test results will be reported as either positive or negative. Ask your health care provider when your results will be ready. This information is not intended to replace advice given to you by your health care provider. Make sure you discuss any questions you have with your health care provider. Document Revised: 10/18/2018 Document Reviewed: 09/15/2018 Elsevier Patient Education  2020 [employer]. Genetic Testing During Pregnancy Genetic testing during pregnancy is also called prenatal genetic testing. This type of testing can determine if your baby is at risk of being born with a disorder caused by abnormal genes or chromosomes (genetic disorder). Chromosomes contain genes that control how your baby will develop in your womb. There are many different genetic disorders. Examples of genetic disorders that may be found through genetic testing include Down syndrome and cystic fibrosis. Gene changes (mutations) can be passed down through families. Genetic testing is offered to all women before or during pregnancy. You can choose whether to have genetic testing. Why is genetic testing done? Genetic testing is done during pregnancy to find out whether your child is at risk for a genetic disorder. Having genetic testing allows you to:  Discuss your test results and options with a genetic counselor.  Prepare for a baby that may  be born with a genetic disorder. Learning about the disorder ahead of time helps you be better prepared to manage it. Your health care providers can also be prepared in case your baby requires special care before or after birth.  Consider whether you want to continue with the pregnancy. In some cases, genetic testing may be done to learn about the traits a child will inherit. Types of genetic tests There are two basic types of genetic testing. Screening tests indicate whether your developing baby (fetus) is at higher risk for a genetic disorder. Diagnostic tests check actual fetal cells to diagnose a genetic disorder. Screening tests     Screening tests will not harm your baby. They are recommended for all pregnant women. Types of screening tests include:  Carrier screening. This test involves checking genes from both parents by testing their blood or saliva. The test checks to find out if the parents  carry a genetic mutation that may be passed to a baby. In most cases, both parents must carry the mutation for a baby to be at risk.  First trimester screening. This test combines a blood test with sound wave imaging of your baby (fetal ultrasound). This screening test checks for a risk of Down syndrome or other defects caused by having extra chromosomes. It also checks for defects of the heart, abdomen, or skeleton.  Second trimester screening also combines a blood test with a fetal ultrasound exam. It checks for a risk of genetic defects of the face, brain, spine, heart, or limbs.  Combined or sequential screening. This type of testing combines the results of first and second trimester screening. This type of testing may be more accurate than first or second trimester screening alone.  Cell-free DNA testing. This is a blood test that detects cells released by the placenta that get into the mother's blood. It can be used to check for a risk of Down syndrome, other extra chromosome syndromes, and  disorders caused by abnormal numbers of sex chromosomes. This test can be done any time after 10 weeks of pregnancy.  Diagnostic tests Diagnostic tests carry slight risks of problems, including bleeding, infection, and loss of the pregnancy. These tests are done only if your baby is at risk for a genetic disorder. You may meet with a genetic counselor to discuss the risks and benefits before having diagnostic tests. Examples of diagnostic tests include:  Chorionic villus sampling (CVS). This involves a procedure to remove and test a sample of cells taken from the placenta. The procedure may be done between 10 and 12 weeks of pregnancy.  Amniocentesis. This involves a procedure to remove and test a sample of fluid (amniotic fluid) and cells from the sac that surrounds the developing baby. The procedure may be done between 15 and 20 weeks of pregnancy. What do the results mean? For a screening test:  If the results are negative, it often means that your child is not at higher risk. There is still a slight chance your child could have a genetic disorder.  If the results are positive, it does not mean your child will have a genetic disorder. It may mean that your child has a higher-than-normal risk for a genetic disorder. In that case, you may want to talk with a genetic counselor about whether you should have diagnostic genetic tests. For a diagnostic test:  If the result is negative, it is unlikely that your child will have a genetic disorder.  If the test is positive for a genetic disorder, it is likely that your child will have the disorder. The test may not tell how severe the disorder will be. Talk with your health care provider about your options. Questions to ask your health care provider Before talking to your health care provider about genetic testing, find out if there is a history of genetic disorders in your family. It may also help to know your family's ethnic origins. Then ask your  health care provider the following questions:  Is my baby at risk for a genetic disorder?  What are the benefits of having genetic screening?  What tests are best for me and my baby?  What are the risks of each test?  If I get a positive result on a screening test, what is the next step?  Should I meet with a genetic counselor before having a diagnostic test?  Should my partner or other members of my  family be tested?  How much do the tests cost? Will my insurance cover the testing? Summary  Genetic testing is done during pregnancy to find out whether your child is at risk for a genetic disorder.  Genetic testing is offered to all women before or during pregnancy. You can choose whether to have genetic testing.  There are two basic types of genetic testing. Screening tests indicate whether your developing baby (fetus) is at higher risk for a genetic disorder. Diagnostic tests check actual fetal cells to diagnose a genetic disorder.  If a diagnostic genetic test is positive, talk with your health care provider about your options. This information is not intended to replace advice given to you by your health care provider. Make sure you discuss any questions you have with your health care provider. Document Revised: 10/18/2018 Document Reviewed: 09/11/2017 Elsevier Patient Education  [place].

## 2019-10-22 LAB — AEROBIC CULTURE W GRAM STAIN (SUPERFICIAL SPECIMEN)

## 2019-11-14 ENCOUNTER — Other Ambulatory Visit: Payer: Self-pay | Admitting: Obstetrics

## 2019-12-05 ENCOUNTER — Telehealth: Payer: BC Managed Care – PPO | Admitting: Family Medicine

## 2019-12-05 ENCOUNTER — Other Ambulatory Visit: Payer: Self-pay

## 2019-12-05 ENCOUNTER — Encounter: Payer: Self-pay | Admitting: Family Medicine

## 2019-12-05 DIAGNOSIS — N3000 Acute cystitis without hematuria: Secondary | ICD-10-CM | POA: Diagnosis not present

## 2019-12-05 DIAGNOSIS — M545 Low back pain, unspecified: Secondary | ICD-10-CM

## 2019-12-05 DIAGNOSIS — Z87448 Personal history of other diseases of urinary system: Secondary | ICD-10-CM

## 2019-12-05 MED ORDER — SULFAMETHOXAZOLE-TRIMETHOPRIM 800-160 MG PO TABS: 1.0000 | ORAL_TABLET | Freq: Two times a day (BID) | ORAL | 0 refills | Status: DC

## 2019-12-05 NOTE — Progress Notes (Signed)
   Subjective:  Documentation for virtual encounter. Telephone visit only due to Epic downtime.   The patient was located at home. 2 patient identifiers used.  The provider was located in the office. The patient did consent to this visit and is aware of possible charges through their insurance for this visit.  The other persons participating in this telemedicine service were none. Time spent on call was 12 minutes and in review of previous records 15 minutes total.  This virtual service is not related to other E/M service within previous 7 days.   Patient ID: Samantha Herring, female    DOB: Sep 27, 1990, 29 y.o.   MRN: [id number]  HPI Chief Complaint  Patient presents with  . UTI   This visit was done via telephone while Epic was down. The notes were entered following the return of Epic.   Complains of a 4-day history of low back pain, lower pelvic discomfort, urinary frequency and dysuria starting yesterday.  History of pyelonephritis in 2015.  States she saw urologist in 2019 and was cleared.  States she and her husband have started having more regular intercourse and are attempting pregnancy. States she took a pregnancy test yesterday which was negative.  She took an at home test for urinary tract infection which was positive.  She has been taking Azo cranberry.  Denies fever, chills, nausea, vomiting, diarrhea or constipation.    Review of Systems Pertinent positives and negatives in the history of present illness.     Objective:   Physical Exam There were no vitals taken for this visit.  Alert and oriented in no acute distress.      Assessment & Plan:  Acute cystitis without hematuria - Plan: sulfamethoxazole-trimethoprim (BACTRIM DS) 800-160 MG tablet  History of pyelonephritis  Acute bilateral low back pain without sciatica  Bactrim sent to her pharmacy.  Increase water intake.  If she is not back to baseline after finished the antibiotic or if she is  worsening she will let me know.

## 2019-12-23 ENCOUNTER — Other Ambulatory Visit: Payer: Self-pay

## 2019-12-23 ENCOUNTER — Encounter: Payer: Self-pay | Admitting: Medical

## 2019-12-23 ENCOUNTER — Ambulatory Visit: Payer: BC Managed Care – PPO | Admitting: Medical

## 2019-12-23 VITALS — BP 124/80 | HR 99 | Ht 69.0 in | Wt 290.2 lb

## 2019-12-23 DIAGNOSIS — N3001 Acute cystitis with hematuria: Secondary | ICD-10-CM | POA: Diagnosis not present

## 2019-12-23 LAB — POCT URINALYSIS DIP (PROADVANTAGE DEVICE)
Bilirubin, UA: NEGATIVE
Glucose, UA: NEGATIVE mg/dL
Ketones, POC UA: NEGATIVE mg/dL
Nitrite, UA: NEGATIVE
Protein Ur, POC: 30 mg/dL — AB
Specific Gravity, Urine: 1.01
Urobilinogen, Ur: NEGATIVE
pH, UA: 6 (ref 5.0–8.0)

## 2019-12-23 MED ORDER — SULFAMETHOXAZOLE-TRIMETHOPRIM 800-160 MG PO TABS: 1.0000 | ORAL_TABLET | Freq: Two times a day (BID) | ORAL | 0 refills | Status: DC

## 2019-12-23 NOTE — Addendum Note (Signed)
Addended by: Victorio Palm on: 12/23/2019 04:01 PM   Modules accepted: Orders

## 2019-12-23 NOTE — Progress Notes (Signed)
Subjective:   Samantha Herring is a 29 y.o. female who complains of possible urinary tract infection.  They report symptoms for 5 days.  Symptoms include urine frequency, urgency, burning, some lower abdominal pressure, mild low back pain.  Patient denies fever, nausea, vomiting, diarrhea, vaginal discharge.  Last UTI was May 2021, but has hx/o prior UTI and prior kidney infection in recent years.  Married, no concern for STD.  Using some OTC Azo.  No other aggravating or relieving factors.  No other c/o.  Past Medical History:  Diagnosis Date  . Anxiety   . Depression   . Migraine headache with aura    since 6th grade  . TMJ arthralgia     Current Outpatient Medications on File Prior to Visit  Medication Sig Dispense Refill  . PARAGARD INTRAUTERINE COPPER IU by Intrauterine route.    . sulfamethoxazole-trimethoprim (BACTRIM DS) 800-160 MG tablet Take 1 tablet by mouth 2 (two) times daily. 10 tablet 0   No current facility-administered medications on file prior to visit.    ROS as in subjective  Reviewed allergies, medications, past medical, surgical, and social history.     Objective: BP 124/80   Pulse 99   Ht 5\' 9"  (1.753 m)   Wt 290 lb 3.2 oz (131.6 kg)   SpO2 97%   BMI 42.86 kg/m   General appearance: alert, no distress, WD/WN, female Abdomen: +bs, soft, non tender, non distended, no masses, no hepatomegaly, no splenomegaly, no bruits Back: no CVA tenderness GU: deferred      Assessment: Encounter Diagnosis  Name Primary?  . Acute cystitis with hematuria Yes     Plan: Discussed symptoms, diagnosis or urinary tract infection, possible complications, and usual course of illness.  Begin medication Bactrim.  Advised increased water intake, can use OTC Azo, Tylenol for pain prn.    Urine culture sent: Yes .    Omnia was seen today for urinary tract infection.  Diagnoses and all orders for this visit:  Acute cystitis with hematuria -     Urine Culture  Other  orders -     sulfamethoxazole-trimethoprim (BACTRIM DS) 800-160 MG tablet; Take 1 tablet by mouth 2 (two) times daily.  Call or return if worse or not improving in the next 3-4 days.

## 2019-12-25 LAB — URINE CULTURE

## 2020-03-24 ENCOUNTER — Other Ambulatory Visit: Payer: Self-pay

## 2020-03-24 ENCOUNTER — Other Ambulatory Visit: Payer: BC Managed Care – PPO

## 2020-03-24 DIAGNOSIS — Z20822 Contact with and (suspected) exposure to covid-19: Secondary | ICD-10-CM

## 2020-03-26 LAB — NOVEL CORONAVIRUS, NAA: SARS-CoV-2, NAA: NOT DETECTED

## 2020-03-26 LAB — SARS-COV-2, NAA 2 DAY TAT

## 2020-04-13 ENCOUNTER — Ambulatory Visit (INDEPENDENT_AMBULATORY_CARE_PROVIDER_SITE_OTHER): Payer: BC Managed Care – PPO | Admitting: Obstetrics and Gynecology

## 2020-04-13 ENCOUNTER — Encounter: Payer: Self-pay | Admitting: Obstetrics and Gynecology

## 2020-04-13 ENCOUNTER — Other Ambulatory Visit: Payer: Self-pay

## 2020-04-13 ENCOUNTER — Telehealth: Payer: Self-pay

## 2020-04-13 VITALS — BP 138/78 | HR 91 | Ht 69.0 in | Wt 289.4 lb

## 2020-04-13 DIAGNOSIS — N926 Irregular menstruation, unspecified: Secondary | ICD-10-CM

## 2020-04-13 NOTE — Telephone Encounter (Signed)
Patient is calling in regards to wanting a blood test done due to possible pregnancy. Patient stated she is having "bleeding and cramping".

## 2020-04-13 NOTE — Progress Notes (Signed)
GYNECOLOGY  VISIT   HPI: 29 y.o.   Married White or Caucasian Not Hispanic or Latino  female   [id number] with Patient's last menstrual period was 03/13/2020.   here for She noticed that she took two pregnancy test on Friday and Saturday that were both positive. She has had bleeding since Saturday.    She had her IUD removed in April and has been trying to get pregnant since then. She was due for her cycle on Friday, felt emotional, just felt off. Some breast tenderness, some nausea. She had a faintly + UPT on Friday. Saturday was +. Bleeding started on Saturday, heavier and cramping more than normal.  Currently feeling okay, bleeding is now light. No pain.   UPT in office to was Negative    GYNECOLOGIC HISTORY: Patient's last menstrual period was 03/13/2020. Contraception:none  Menopausal hormone therapy: none        OB History    Gravida  1   Para  0   Term  0   Preterm  0   AB  1   Living  0     SAB  0   TAB  0   Ectopic  0   Multiple  0   Live Births  0              Patient Active Problem List   Diagnosis Date Noted  . History of pyelonephritis 03/16/2018  . Morbid obesity ([place]) 05/10/2016  . TMJ arthralgia   . Migraine headache with aura     Past Medical History:  Diagnosis Date  . Anxiety   . Depression   . Migraine headache with aura    since 6th grade  . TMJ arthralgia     No past surgical history on file.  No current outpatient medications on file.   No current facility-administered medications for this visit.     ALLERGIES: Patient has no known allergies.  Family History  Problem Relation Age of Onset  . Cancer Mother        lymphoma  . Hypertension Mother   . Stroke Sister 17        estrogen related  . Heart disease Maternal Grandmother   . Hyperlipidemia Maternal Grandmother   . Stroke Maternal Grandmother   . Alzheimer's disease Maternal Grandmother   . Cancer Maternal Grandfather   . Multiple myeloma Maternal Grandfather    . Heart disease Paternal Grandmother   . Heart disease Paternal Grandfather     Social History   Socioeconomic History  . Marital status: Married    Spouse name: Not on file  . Number of children: Not on file  . Years of education: Not on file  . Highest education level: Not on file  Occupational History  . Not on file  Tobacco Use  . Smoking status: Never Smoker  . Smokeless tobacco: Never Used  Vaping Use  . Vaping Use: Never used  Substance and Sexual Activity  . Alcohol use: Yes    Alcohol/week: 1.0 standard drink    Types: 1 Standard drinks or equivalent per week  . Drug use: No  . Sexual activity: Yes    Birth control/protection: I.U.D.    Comment: Paragard inserted 2015  Other Topics Concern  . Not on file  Social History Narrative  . Not on file   Social Determinants of Health   Financial Resource Strain:   . Difficulty of Paying Living Expenses: Not on file  Food Insecurity:   .  Worried About Charity fundraiser in the Last Year: Not on file  . Ran Out of Food in the Last Year: Not on file  Transportation Needs:   . Lack of Transportation (Medical): Not on file  . Lack of Transportation (Non-Medical): Not on file  Physical Activity:   . Days of Exercise per Week: Not on file  . Minutes of Exercise per Session: Not on file  Stress:   . Feeling of Stress : Not on file  Social Connections:   . Frequency of Communication with Friends and Family: Not on file  . Frequency of Social Gatherings with Friends and Family: Not on file  . Attends Religious Services: Not on file  . Active Member of Clubs or Organizations: Not on file  . Attends Archivist Meetings: Not on file  . Marital Status: Not on file  Intimate Partner Violence:   . Fear of Current or Ex-Partner: Not on file  . Emotionally Abused: Not on file  . Physically Abused: Not on file  . Sexually Abused: Not on file    Review of Systems  Gastrointestinal: Positive for nausea.  All  other systems reviewed and are negative.   PHYSICAL EXAMINATION:    BP 138/78   Pulse 91   Ht _0  (1.753 m)   Wt 289 lb 6.4 oz (131.3 kg)   LMP 03/13/2020   SpO2 99%   BMI 42.74 kg/m     General appearance: alert, cooperative and appears stated age  Pelvic: External genitalia:  no lesions              Urethra:  normal appearing urethra with no masses, tenderness or lesions              Bartholins and Skenes: normal                 Cervix: no cervical motion tenderness              Bimanual Exam:  Uterus:  normal size, contour, position, consistency, mobility, non-tender              Adnexa: no mass, fullness, tenderness                Chaperone was present for exam.  ASSESSMENT Late menses, + UPT at home. Bleed heavily over the weekend    PLAN UPT negative here BhcG, T&S

## 2020-04-13 NOTE — Telephone Encounter (Signed)
AEX 03/2019, not scheduled ParaGard IUD removed 10/2019  Spoke with pt. Pt states being late on Friday for LMP. Had pregnancy sx of mild nausea and smell/taste changes. Denies vomiting and diarrhea, fever,chills.   Pt states then Saturday had light bleeding/spotting with changing menstrual cup. States unknown of how much bleeding, lighter than normal cycle. Describes as pink, brown. Pt states took home UPTs over weekend and all positive. Pt states has been trying to conceive the last 5 months. Pt states having light pink spotting today still, but denies cramps, heavy bleeding, clots, abd pain.  Pt requesting to come for blood testing for pregnancy.  Pt advised ok to have OV. OV scheduled today 10/4 at 10 am. Pt agreeable to date and time of appt.  Encounter closed.

## 2020-04-14 LAB — ABO AND RH: Rh Factor: POSITIVE

## 2020-04-14 LAB — BETA HCG QUANT (REF LAB): hCG Quant: 6 m[IU]/mL

## 2020-04-21 ENCOUNTER — Telehealth: Payer: Self-pay

## 2020-04-21 ENCOUNTER — Other Ambulatory Visit: Payer: BC Managed Care – PPO

## 2020-04-21 DIAGNOSIS — Z20822 Contact with and (suspected) exposure to covid-19: Secondary | ICD-10-CM

## 2020-04-21 NOTE — Telephone Encounter (Signed)
Spoke with pt. Pt states having 2 +preg test this morning. Pt was seen last week on 10/4 with neg UPT in office and Beta of 6. States the home UPTs lines were darker than last week. LMP 9/3 as regular cycle and bleeding and 10/2 with heavy bleeding x 1 day and then spotting that ended on 10/5.  Pt states having increased white/clear discharge, breast/nipple soreness and still having slight nausea since OV on 10/4. Denies heavy bleeding, spotting, abd cramps/pain.  Pt concerned with still having sx and would like to come for OV.  OV advised for further evaluation. Pt agreeable. Pt scheduled for work-in 10/13 at 1145 am. Pt agreeable to date and time of appt.  Routing to Dr Oscar La for update  Encounter closed.

## 2020-04-21 NOTE — Telephone Encounter (Signed)
Patient is calling in regards to wanting to speak with nurse and "come in for another pregnancy test" after being seen in our office last week.

## 2020-04-21 NOTE — Telephone Encounter (Signed)
Left message for pt to return call to triage RN. 

## 2020-04-21 NOTE — Telephone Encounter (Signed)
She needs a BhcG, given the time of day we can do this tomorrow, but please give her ectopic precautions. If she is having any significant pain overnight she needs to go to the [facility].

## 2020-04-21 NOTE — Telephone Encounter (Signed)
Pt given ectopic precautions. Pt denies any abd pain at this time. Will keep OV tomorrow.  Encounter closed

## 2020-04-22 ENCOUNTER — Telehealth: Payer: Self-pay | Admitting: *Deleted

## 2020-04-22 ENCOUNTER — Other Ambulatory Visit (INDEPENDENT_AMBULATORY_CARE_PROVIDER_SITE_OTHER): Payer: BC Managed Care – PPO

## 2020-04-22 ENCOUNTER — Ambulatory Visit: Payer: Self-pay | Admitting: Obstetrics and Gynecology

## 2020-04-22 ENCOUNTER — Other Ambulatory Visit: Payer: Self-pay

## 2020-04-22 DIAGNOSIS — N926 Irregular menstruation, unspecified: Secondary | ICD-10-CM

## 2020-04-22 DIAGNOSIS — O0281 Inappropriate change in quantitative human chorionic gonadotropin (hCG) in early pregnancy: Secondary | ICD-10-CM

## 2020-04-22 LAB — SARS-COV-2, NAA 2 DAY TAT

## 2020-04-22 LAB — BETA HCG QUANT (REF LAB): hCG Quant: 29 m[IU]/mL

## 2020-04-22 LAB — NOVEL CORONAVIRUS, NAA: SARS-CoV-2, NAA: NOT DETECTED

## 2020-04-22 NOTE — Telephone Encounter (Signed)
Reviewed plan of care with Dr. Oscar La.  Call placed to patient patient in f/u to 04/21/20 telephone encounter.  Patient denies vaginal bleeding or pain.  Patient agreeable to come into office today for STAT beta Hcg, will f/u with results and recommendations once completed.  Lab appt scheduled for 10:25am today. OV cancelled.  Patient verbalizes understanding and is agreeable.   Routing to Dr. Oscar La to f/u once lab completed.

## 2020-04-22 NOTE — Telephone Encounter (Signed)
04/22/20 STAT beta Hcg 29  10/4/21Beta hcg 6    Routing to Dr. Oscar La to review and advise.

## 2020-04-22 NOTE — Telephone Encounter (Signed)
Reviewed with Dr. Oscar La.  Repeat beta Hcg in 48 hours, on 04/24/20.   Call returned to patient, advised of results and recommendations.  Patient denies pain or vaginal bleeding.  [facility] precautions reviewed.  Lab appt scheduled for 04/24/20 at 9:15am, lab order placed.  Patient verbalizes understanding and is agreeable.   Routing to provider for final review. Patient is agreeable to disposition. Will close encounter.  Cc: Dr. Edward Jolly

## 2020-04-24 ENCOUNTER — Other Ambulatory Visit (INDEPENDENT_AMBULATORY_CARE_PROVIDER_SITE_OTHER): Payer: BC Managed Care – PPO

## 2020-04-24 ENCOUNTER — Other Ambulatory Visit: Payer: Self-pay

## 2020-04-24 ENCOUNTER — Telehealth: Payer: Self-pay | Admitting: Obstetrics and Gynecology

## 2020-04-24 DIAGNOSIS — O0281 Inappropriate change in quantitative human chorionic gonadotropin (hCG) in early pregnancy: Secondary | ICD-10-CM

## 2020-04-24 DIAGNOSIS — N926 Irregular menstruation, unspecified: Secondary | ICD-10-CM

## 2020-04-24 LAB — BETA HCG QUANT (REF LAB): hCG Quant: 40 m[IU]/mL

## 2020-04-24 NOTE — Telephone Encounter (Signed)
Spoke with pt. Pt given results and recommendations per Dr Edward Jolly. Pt agreeable and verbalized understanding. Pt scheduled for lab on 10/18 at 845 am. Pt agreeable to date and time of lab.  Orders placed.  Pt advised ectopic precautions , pain and bleeding over weekend and go to [facility] hospital. Pt verbalized understanding.  Cc: Dr Oscar La for [facility]

## 2020-04-24 NOTE — Telephone Encounter (Signed)
Left message for pt to return call to triage RN. 

## 2020-04-24 NOTE — Telephone Encounter (Signed)
Please contact patient with results of her hCG from this am.  Her level is now 40 today, which is just under the desired number of 44.  I recommend repeating the level again in the office on Monday morning.  If she has pain or bleeding during the weekend, she can do to [facility].  Please give ectopic precautions:  No sexual activity, no exercise or heavy physical exertion.

## 2020-04-27 ENCOUNTER — Other Ambulatory Visit: Payer: Self-pay

## 2020-04-27 ENCOUNTER — Other Ambulatory Visit (INDEPENDENT_AMBULATORY_CARE_PROVIDER_SITE_OTHER): Payer: BC Managed Care – PPO

## 2020-04-27 ENCOUNTER — Telehealth: Payer: Self-pay | Admitting: *Deleted

## 2020-04-27 DIAGNOSIS — N926 Irregular menstruation, unspecified: Secondary | ICD-10-CM

## 2020-04-27 DIAGNOSIS — O209 Hemorrhage in early pregnancy, unspecified: Secondary | ICD-10-CM

## 2020-04-27 DIAGNOSIS — O0281 Inappropriate change in quantitative human chorionic gonadotropin (hCG) in early pregnancy: Secondary | ICD-10-CM

## 2020-04-27 LAB — BETA HCG QUANT (REF LAB): hCG Quant: 73 m[IU]/mL

## 2020-04-27 NOTE — Telephone Encounter (Signed)
-----   Message from Romualdo Bolk, MD sent at 04/27/2020  1:03 PM EDT ----- The patient's BhcG is not rising normally. Initially the BhcG of 6 was thought to be a SAB (+UPT 3 days prior then negative UPT day of blood draw with bleed in between). Now she has been having a slowly rising BhCG over the last 5 days. Please get her in for an ultrasound and set her up for another BhcG in 2 days. Review ectopic precautions.

## 2020-04-27 NOTE — Telephone Encounter (Signed)
Spoke with patient. Advised of results and recommendations as seen below per Dr. Oscar La.  Patient agreeable to proceed with OB US and repeat beta Hcg. Ectopic precautions reviewed. Advised I will call to schedule and return call with appt information, patient agreeable.

## 2020-04-27 NOTE — Telephone Encounter (Signed)
Spoke with Lyla Son, [facility] main radiology scheduling. Advised no available appts for OB US at  [facility] today or Wednesday, no sonographer on Tues or Thurs, patient would need to go to MAU for evaluation and PUS.   Reviewed with Dr. Oscar La, call returned to patient.  Patient denies pain or vaginal bleeding.  PUS scheduled at [facility] on 10/21 at 10am, consult to follow at 10:30 with Dr. Oscar La.  Lab appt scheduled for 10/20 at 8:45am.  Orders placed for PUS and lab.  Ectopic precautions again reviewed.  Patient verbalizes understanding and is agreeable.   Routing to provider for final review. Patient is agreeable to disposition. Will close encounter.  Cc: Hayley Carder

## 2020-04-28 ENCOUNTER — Other Ambulatory Visit: Payer: Self-pay

## 2020-04-28 ENCOUNTER — Inpatient Hospital Stay (HOSPITAL_COMMUNITY)
Admission: AD | Admit: 2020-04-28 | Discharge: 2020-04-28 | Disposition: A | Discharge status: Home / Self Care | Payer: BC Managed Care – PPO | Attending: Obstetrics and Gynecology | Admitting: Obstetrics and Gynecology

## 2020-04-28 ENCOUNTER — Inpatient Hospital Stay (HOSPITAL_COMMUNITY): Payer: BC Managed Care – PPO

## 2020-04-28 ENCOUNTER — Telehealth: Payer: Self-pay

## 2020-04-28 DIAGNOSIS — O26891 Other specified pregnancy related conditions, first trimester: Secondary | ICD-10-CM | POA: Insufficient documentation

## 2020-04-28 DIAGNOSIS — Z3A00 Weeks of gestation of pregnancy not specified: Secondary | ICD-10-CM | POA: Diagnosis not present

## 2020-04-28 DIAGNOSIS — O3680X0 Pregnancy with inconclusive fetal viability, not applicable or unspecified: Secondary | ICD-10-CM

## 2020-04-28 DIAGNOSIS — Z6790 Unspecified blood type, Rh positive: Secondary | ICD-10-CM | POA: Diagnosis not present

## 2020-04-28 DIAGNOSIS — R109 Unspecified abdominal pain: Secondary | ICD-10-CM | POA: Diagnosis not present

## 2020-04-28 DIAGNOSIS — O26899 Other specified pregnancy related conditions, unspecified trimester: Secondary | ICD-10-CM

## 2020-04-28 LAB — WET PREP, GENITAL
Clue Cells Wet Prep HPF POC: NONE SEEN
Sperm: NONE SEEN
Trich, Wet Prep: NONE SEEN
Yeast Wet Prep HPF POC: NONE SEEN

## 2020-04-28 LAB — COMPREHENSIVE METABOLIC PANEL
ALT: 26 U/L (ref 0–44)
AST: 22 U/L (ref 15–41)
Albumin: 4.1 g/dL (ref 3.5–5.0)
Alkaline Phosphatase: 75 U/L (ref 38–126)
Anion gap: 10 (ref 5–15)
BUN: 10 mg/dL (ref 6–20)
CO2: 23 mmol/L (ref 22–32)
Calcium: 9.2 mg/dL (ref 8.9–10.3)
Chloride: 107 mmol/L (ref 98–111)
Creatinine, Ser: 0.83 mg/dL (ref 0.44–1.00)
GFR, Estimated: >60 mL/min (ref >60)
Glucose, Bld: 104 mg/dL — ABNORMAL HIGH (ref 70–99)
Potassium: 3.8 mmol/L (ref 3.5–5.1)
Sodium: 140 mmol/L (ref 135–145)
Total Bilirubin: 0.7 mg/dL (ref 0.3–1.2)
Total Protein: 7.6 g/dL (ref 6.5–8.1)

## 2020-04-28 LAB — URINALYSIS, ROUTINE W REFLEX MICROSCOPIC
Bilirubin Urine: NEGATIVE
Glucose, UA: NEGATIVE mg/dL
Hgb urine dipstick: NEGATIVE
Ketones, ur: NEGATIVE mg/dL
Nitrite: NEGATIVE
Protein, ur: NEGATIVE mg/dL
Specific Gravity, Urine: 1.02 (ref 1.005–1.030)
pH: 7 (ref 5.0–8.0)

## 2020-04-28 LAB — CBC
HCT: 46.1 % — ABNORMAL HIGH (ref 36.0–46.0)
Hemoglobin: 15.4 g/dL — ABNORMAL HIGH (ref 12.0–15.0)
MCH: 28.1 pg (ref 26.0–34.0)
MCHC: 33.4 g/dL (ref 30.0–36.0)
MCV: 84.1 fL (ref 80.0–100.0)
Platelets: 320 10*3/uL (ref 150–400)
RBC: 5.48 MIL/uL — ABNORMAL HIGH (ref 3.87–5.11)
RDW: 12.6 % (ref 11.5–15.5)
WBC: 11.2 10*3/uL — ABNORMAL HIGH (ref 4.0–10.5)
nRBC: 0 % (ref 0.0–0.2)

## 2020-04-28 LAB — HCG, QUANTITATIVE, PREGNANCY: hCG, Beta Chain, Quant, S: 109 m[IU]/mL — ABNORMAL HIGH (ref <5)

## 2020-04-28 NOTE — Telephone Encounter (Signed)
Patient is calling in regards to mid right abdomen and back pains.

## 2020-04-28 NOTE — MAU Note (Signed)
Pt stated she has been sen in office for early pregnancy. BHCG rising slowly. Md told her to come in f she stared to have pain or cramping. Pt stted she has been having right  mid abd/flank pain that radiates toward her back and lower back ache for a few days. No bleeding or vag discharge.

## 2020-04-28 NOTE — Telephone Encounter (Signed)
Spoke with patient. Patient is pregant, following beta Hcgs for abnormal rise in hcg and bleeding in early pregnancy.  Patient reports mid lower back and mid lower abdominal pain that started last night, has become more frequent and intense. Also reports upper right neck and shoulder pain. Denies SHOB, fever/chills, N/V or vaginal bleeding.   Instructed patient to go directly to [facility] MAU for further evaluation with PUS. Advised patient to f/u with office after evaluation complete, we can then review plan of care and r/s beta hcg scheduled for 10/20 and cancel PUS for 10/21 if no longer needed. Advised patient I will update Dr. Oscar La and return call if any additional recommendations, patient verbalizes understanding and is agreeable.   Routing to Dr. Oscar La

## 2020-04-28 NOTE — Discharge Instructions (Signed)
Ectopic Pregnancy ° °An ectopic pregnancy is when the fertilized egg attaches (implants) outside the uterus. Most ectopic pregnancies occur in one of the tubes where eggs travel from the ovary to the uterus (fallopian tubes), but the implanting can occur in other locations. In rare cases, ectopic pregnancies occur on the ovary, intestine, pelvis, abdomen, or cervix. In an ectopic pregnancy, the fertilized egg does not have the ability to develop into a normal, healthy baby. °A ruptured ectopic pregnancy is one in which tearing or bursting of a fallopian tube causes internal bleeding. Often, there is intense lower abdominal pain, and vaginal bleeding sometimes occurs. Having an ectopic pregnancy can be life-threatening. If this dangerous condition is not treated, it can lead to blood loss, shock, or even death. °What are the causes? °The most common cause of this condition is damage to one of the fallopian tubes. A fallopian tube may be narrowed or blocked, and that keeps the fertilized egg from reaching the uterus. °What increases the risk? °This condition is more likely to develop in women of childbearing age who have different levels of risk. The levels of risk can be divided into three categories. °High risk °· You have gone through infertility treatment. °· You have had an ectopic pregnancy before. °· You have had surgery on the fallopian tubes, or another surgical procedure, such as an abortion. °· You have had surgery to have the fallopian tubes tied (tubal ligation). °· You have problems or diseases of the fallopian tubes. °· You have been exposed to diethylstilbestrol (DES). This medicine was used until 1971, and it had effects on babies whose mothers took the medicine. °· You become pregnant while using an IUD (intrauterine device) for birth control. °Moderate risk °· You have a history of infertility. °· You have had an STI (sexually transmitted infection). °· You have a history of pelvic inflammatory  disease (PID). °· You have scarring from endometriosis. °· You have multiple sexual partners. °· You smoke. °Low risk °· You have had pelvic surgery. °· You use vaginal douches. °· You became sexually active before age 18. °What are the signs or symptoms? °Common symptoms of this condition include normal pregnancy symptoms, such as missing a period, nausea, tiredness, abdominal pain, breast tenderness, and bleeding. However, ectopic pregnancy will have additional symptoms, such as: °· Pain with intercourse. °· Irregular vaginal bleeding or spotting. °· Cramping or pain on one side or in the lower abdomen. °· Fast heartbeat, low blood pressure, and sweating. °· Passing out while having a bowel movement. °Symptoms of a ruptured ectopic pregnancy and internal bleeding may include: °· Sudden, severe pain in the abdomen and pelvis. °· Dizziness, weakness, light-headedness, or fainting. °· Pain in the shoulder or neck area. °How is this diagnosed? °This condition is diagnosed by: °· A pelvic exam to locate pain or a mass in the abdomen. °· A pregnancy test. This blood test checks for the presence as well as the specific level of pregnancy hormone in the bloodstream. °· Ultrasound. This is performed if a pregnancy test is positive. In this test, a probe is inserted into the vagina. The probe will detect a fetus, possibly in a location other than the uterus. °· Taking a sample of uterus tissue (dilation and curettage, or D&C). °· Surgery to perform a visual exam of the inside of the abdomen using a thin, lighted tube that has a tiny camera on the end (laparoscope). °· Culdocentesis. This procedure involves inserting a needle at the top of   the vagina, behind the uterus. If blood is present in this area, it may indicate that a fallopian tube is torn. How is this treated? This condition is treated with medicine or surgery. Medicine  An injection of a medicine (methotrexate) may be given to cause the pregnancy tissue to be  absorbed. This medicine may save your fallopian tube. It may be given if: ? The diagnosis is made early, with no signs of active bleeding. ? The fallopian tube has not ruptured. ? You are considered to be a good candidate for the medicine. Usually, pregnancy hormone blood levels are checked after methotrexate treatment. This is to be sure that the medicine is effective. It may take 4-6 weeks for the pregnancy to be absorbed. Most pregnancies will be absorbed by 3 weeks. Surgery  A laparoscope may be used to remove the pregnancy tissue.  If severe internal bleeding occurs, a larger cut (incision) may be made in the lower abdomen (laparotomy) to remove the fetus and placenta. This is done to stop the bleeding.  Part or all of the fallopian tube may be removed (salpingectomy) along with the fetus and placenta. The fallopian tube may also be repaired during the surgery.  In very rare circumstances, removal of the uterus (hysterectomy) may be required.  After surgery, pregnancy hormone testing may be done to be sure that there is no pregnancy tissue left. Whether your treatment is medicine or surgery, you may receive a Rho (D) immune globulin shot to prevent problems with any future pregnancy. This shot may be given if:  You are Rh-negative and the baby's father is Rh-positive.  You are Rh-negative and you do not know the Rh type of the baby's father. Follow these instructions at home:  Rest and limit your activity after the procedure for as long as told by your health care provider.  Until your health care provider says that it is safe: ? Do not lift anything that is heavier than 10 lb (4.5 kg), or the limit that your health care provider tells you. ? Avoid physical exercise and any movement that requires effort (is strenuous).  To help prevent constipation: ? Eat a healthy diet that includes fruits, vegetables, and whole grains. ? Drink 6-8 glasses of water per day. Get help right away  if:  You develop worsening pain that is not relieved by medicine.  You have: ? A fever or chills. ? Vaginal bleeding. ? Redness and swelling at the incision site. ? Nausea and vomiting.  You feel dizzy or weak.  You feel light-headed or you faint. This information is not intended to replace advice given to you by your health care provider. Make sure you discuss any questions you have with your health care provider. Document Revised: 06/09/2017 Document Reviewed: 01/27/2016 Elsevier Patient Education  2020 [employer]        First Trimester of Pregnancy The first trimester of pregnancy is from week 1 until the end of week 13 (months 1 through 3). A week after a sperm fertilizes an egg, the egg will implant on the wall of the uterus. This embryo will begin to develop into a baby. Genes from you and your partner will form the baby. The female genes will determine whether the baby will be a boy or a girl. At 6-8 weeks, the eyes and face will be formed, and the heartbeat can be seen on ultrasound. At the end of 12 weeks, all the baby's organs will be formed. Now that you are   pregnant, you will want to do everything you can to have a healthy baby. Two of the most important things are to get good prenatal care and to follow your health care provider's instructions. Prenatal care is all the medical care you receive before the baby's birth. This care will help prevent, find, and treat any problems during the pregnancy and childbirth. Body changes during your first trimester Your body goes through many changes during pregnancy. The changes vary from woman to woman.  You may gain or lose a couple of pounds at first.  You may feel sick to your stomach (nauseous) and you may throw up (vomit). If the vomiting is uncontrollable, call your health care provider.  You may tire easily.  You may develop headaches that can be relieved by medicines. All medicines should be approved by your health care  provider.  You may urinate more often. Painful urination may mean you have a bladder infection.  You may develop heartburn as a result of your pregnancy.  You may develop constipation because certain hormones are causing the muscles that push stool through your intestines to slow down.  You may develop hemorrhoids or swollen veins (varicose veins).  Your breasts may begin to grow larger and become tender. Your nipples may stick out more, and the tissue that surrounds them (areola) may become darker.  Your gums may bleed and may be sensitive to brushing and flossing.  Dark spots or blotches (chloasma, mask of pregnancy) may develop on your face. This will likely fade after the baby is born.  Your menstrual periods will stop.  You may have a loss of appetite.  You may develop cravings for certain kinds of food.  You may have changes in your emotions from day to day, such as being excited to be pregnant or being concerned that something may go wrong with the pregnancy and baby.  You may have more vivid and strange dreams.  You may have changes in your hair. These can include thickening of your hair, rapid growth, and changes in texture. Some women also have hair loss during or after pregnancy, or hair that feels dry or thin. Your hair will most likely return to normal after your baby is born. What to expect at prenatal visits During a routine prenatal visit:  You will be weighed to make sure you and the baby are growing normally.  Your blood pressure will be taken.  Your abdomen will be measured to track your baby's growth.  The fetal heartbeat will be listened to between weeks 10 and 14 of your pregnancy.  Test results from any previous visits will be discussed. Your health care provider may ask you:  How you are feeling.  If you are feeling the baby move.  If you have had any abnormal symptoms, such as leaking fluid, bleeding, severe headaches, or abdominal cramping.  If  you are using any tobacco products, including cigarettes, chewing tobacco, and electronic cigarettes.  If you have any questions. Other tests that may be performed during your first trimester include:  Blood tests to find your blood type and to check for the presence of any previous infections. The tests will also be used to check for low iron levels (anemia) and protein on red blood cells (Rh antibodies). Depending on your risk factors, or if you previously had diabetes during pregnancy, you may have tests to check for high blood sugar that affects pregnant women (gestational diabetes).  Urine tests to check for infections, diabetes,  or protein in the urine.  An ultrasound to confirm the proper growth and development of the baby.  Fetal screens for spinal cord problems (spina bifida) and Down syndrome.  HIV (human immunodeficiency virus) testing. Routine prenatal testing includes screening for HIV, unless you choose not to have this test.  You may need other tests to make sure you and the baby are doing well. Follow these instructions at home: Medicines  Follow your health care provider's instructions regarding medicine use. Specific medicines may be either safe or unsafe to take during pregnancy.  Take a prenatal vitamin that contains at least 600 micrograms (mcg) of folic acid.  If you develop constipation, try taking a stool softener if your health care provider approves. Eating and drinking   Eat a balanced diet that includes fresh fruits and vegetables, whole grains, good sources of protein such as meat, eggs, or tofu, and low-fat dairy. Your health care provider will help you determine the amount of weight gain that is right for you.  Avoid raw meat and uncooked cheese. These carry germs that can cause birth defects in the baby.  Eating four or five small meals rather than three large meals a day may help relieve nausea and vomiting. If you start to feel nauseous, eating a few  soda crackers can be helpful. Drinking liquids between meals, instead of during meals, also seems to help ease nausea and vomiting.  Limit foods that are high in fat and processed sugars, such as fried and sweet foods.  To prevent constipation: ? Eat foods that are high in fiber, such as fresh fruits and vegetables, whole grains, and beans. ? Drink enough fluid to keep your urine clear or pale yellow. Activity  Exercise only as directed by your health care provider. Most women can continue their usual exercise routine during pregnancy. Try to exercise for 30 minutes at least 5 days a week. Exercising will help you: ? Control your weight. ? Stay in shape. ? Be prepared for labor and delivery.  Experiencing pain or cramping in the lower abdomen or lower back is a good sign that you should stop exercising. Check with your health care provider before continuing with normal exercises.  Try to avoid standing for long periods of time. Move your legs often if you must stand in one place for a long time.  Avoid heavy lifting.  Wear low-heeled shoes and practice good posture.  You may continue to have sex unless your health care provider tells you not to. Relieving pain and discomfort  Wear a good support bra to relieve breast tenderness.  Take warm sitz baths to soothe any pain or discomfort caused by hemorrhoids. Use hemorrhoid cream if your health care provider approves.  Rest with your legs elevated if you have leg cramps or low back pain.  If you develop varicose veins in your legs, wear support hose. Elevate your feet for 15 minutes, 3-4 times a day. Limit salt in your diet. Prenatal care  Schedule your prenatal visits by the twelfth week of pregnancy. They are usually scheduled monthly at first, then more often in the last 2 months before delivery.  Write down your questions. Take them to your prenatal visits.  Keep all your prenatal visits as told by your health care provider.  This is important. Safety  Wear your seat belt at all times when driving.  Make a list of emergency phone numbers, including numbers for family, friends, the hospital, and police and fire departments. General   instructions  Ask your health care provider for a referral to a local prenatal education class. Begin classes no later than the beginning of month 6 of your pregnancy.  Ask for help if you have counseling or nutritional needs during pregnancy. Your health care provider can offer advice or refer you to specialists for help with various needs.  Do not use hot tubs, steam rooms, or saunas.  Do not douche or use tampons or scented sanitary pads.  Do not cross your legs for long periods of time.  Avoid cat litter boxes and soil used by cats. These carry germs that can cause birth defects in the baby and possibly loss of the fetus by miscarriage or stillbirth.  Avoid all smoking, herbs, alcohol, and medicines not prescribed by your health care provider. Chemicals in these products affect the formation and growth of the baby.  Do not use any products that contain nicotine or tobacco, such as cigarettes and e-cigarettes. If you need help quitting, ask your health care provider. You may receive counseling support and other resources to help you quit.  Schedule a dentist appointment. At home, brush your teeth with a soft toothbrush and be gentle when you floss. Contact a health care provider if:  You have dizziness.  You have mild pelvic cramps, pelvic pressure, or nagging pain in the abdominal area.  You have persistent nausea, vomiting, or diarrhea.  You have a bad smelling vaginal discharge.  You have pain when you urinate.  You notice increased swelling in your face, hands, legs, or ankles.  You are exposed to fifth disease or chickenpox.  You are exposed to German measles (rubella) and have never had it. Get help right away if:  You have a fever.  You are leaking fluid  from your vagina.  You have spotting or bleeding from your vagina.  You have severe abdominal cramping or pain.  You have rapid weight gain or loss.  You vomit blood or material that looks like coffee grounds.  You develop a severe headache.  You have shortness of breath.  You have any kind of trauma, such as from a fall or a car accident. Summary  The first trimester of pregnancy is from week 1 until the end of week 13 (months 1 through 3).  Your body goes through many changes during pregnancy. The changes vary from woman to woman.  You will have routine prenatal visits. During those visits, your health care provider will examine you, discuss any test results you may have, and talk with you about how you are feeling. This information is not intended to replace advice given to you by your health care provider. Make sure you discuss any questions you have with your health care provider. Document Revised: 06/09/2017 Document Reviewed: 06/08/2016 Elsevier Patient Education  2020 [employer]        Miscarriage A miscarriage is the loss of an unborn baby (fetus) before the 20th week of pregnancy. Most miscarriages happen during the first 3 months of pregnancy. Sometimes, a miscarriage can happen before a woman knows that she is pregnant. Having a miscarriage can be an emotional experience. If you have had a miscarriage, talk with your health care provider about any questions you may have about miscarrying, the grieving process, and your plans for future pregnancy. What are the causes? A miscarriage may be caused by:  Problems with the genes or chromosomes of the fetus. These problems make it impossible for the baby to develop normally. They   are often the result of random errors that occur early in the development of the baby, and are not passed from parent to child (not inherited).  Infection of the cervix or uterus.  Conditions that affect hormone balance in the  body.  Problems with the cervix, such as the cervix opening and thinning before pregnancy is at term (cervical insufficiency).  Problems with the uterus. These may include: ? A uterus with an abnormal shape. ? Fibroids in the uterus. ? Congenital abnormalities. These are problems that were present at birth.  Certain medical conditions.  Smoking, drinking alcohol, or using drugs.  Injury (trauma). In many cases, the cause of a miscarriage is not known. What are the signs or symptoms? Symptoms of this condition include:  Vaginal bleeding or spotting, with or without cramps or pain.  Pain or cramping in the abdomen or lower back.  Passing fluid, tissue, or blood clots from the vagina. How is this diagnosed? This condition may be diagnosed based on:  A physical exam.  Ultrasound.  Blood tests.  Urine tests. How is this treated? Treatment for a miscarriage is sometimes not necessary if you naturally pass all the tissue that was in your uterus. If necessary, this condition may be treated with:  Dilation and curettage (D&C). This is a procedure in which the cervix is stretched open and the lining of the uterus (endometrium) is scraped. This is done only if tissue from the fetus or placenta remains in the body (incomplete miscarriage).  Medicines, such as: ? Antibiotic medicine, to treat infection. ? Medicine to help the body pass any remaining tissue. ? Medicine to reduce (contract) the size of the uterus. These medicines may be given if you have a lot of bleeding. If you have Rh negative blood and your baby was Rh positive, you will need a shot of a medicine called Rh immunoglobulinto protect your future babies from Rh blood problems. "Rh-negative" and "Rh-positive" refer to whether or not the blood has a specific protein found on the surface of red blood cells (Rh factor). Follow these instructions at home: Medicines   Take over-the-counter and prescription medicines only as  told by your health care provider.  If you were prescribed antibiotic medicine, take it as told by your health care provider. Do not stop taking the antibiotic even if you start to feel better.  Do not take NSAIDs, such as aspirin and ibuprofen, unless they are approved by your health care provider. These medicines can cause bleeding. Activity  Rest as directed. Ask your health care provider what activities are safe for you.  Have someone help with home and family responsibilities during this time. General instructions  Keep track of the number of sanitary pads you use each day and how soaked (saturated) they are. Write down this information.  Monitor the amount of tissue or blood clots that you pass from your vagina. Save any large amounts of tissue for your health care provider to examine.  Do not use tampons, douche, or have sex until your health care provider approves.  To help you and your partner with the process of grieving, talk with your health care provider or seek counseling.  When you are ready, meet with your health care provider to discuss any important steps you should take for your health. Also, discuss steps you should take to have a healthy pregnancy in the future.  Keep all follow-up visits as told by your health care provider. This is important. Where to find   more information  [employer]: www.acog.org  [place] [employer] of [employer] and [employer] Women's [employer]: http://hoffman.com/ Contact a health care provider if:  You have a fever or chills.  You have a foul smelling vaginal discharge.  You have more bleeding instead of less. Get help right away if:  You have severe cramps or pain in your back or abdomen.  You pass blood clots or tissue from your vagina that is walnut-sized or larger.  You soak more than 1 regular sanitary pad in an hour.  You become light-headed or weak.  You pass out.  You  have feelings of sadness that take over your thoughts, or you have thoughts of hurting yourself. Summary  Most miscarriages happen in the first 3 months of pregnancy. Sometimes miscarriage happens before a woman even knows that she is pregnant.  Follow your health care provider's instruction for home care. Keep all follow-up appointments.  To help you and your partner with the process of grieving, talk with your health care provider or seek counseling. This information is not intended to replace advice given to you by your health care provider. Make sure you discuss any questions you have with your health care provider. Document Revised: 10/19/2018 Document Reviewed: 08/02/2016 Elsevier Patient Education  2020 [employer].                        Safe Medications in Pregnancy    Acne: Benzoyl Peroxide Salicylic Acid  Backache/Headache: Tylenol: 2 regular strength every 4 hours OR              2 Extra strength every 6 hours  Colds/Coughs/Allergies: Benadryl (alcohol free) 25 mg every 6 hours as needed Breath right strips Claritin Cepacol throat lozenges Chloraseptic throat spray Cold-Eeze- up to three times per day Cough drops, alcohol free Flonase (by prescription only) Guaifenesin Mucinex Robitussin DM (plain only, alcohol free) Saline nasal spray/drops Sudafed (pseudoephedrine) & Actifed ** use only after [redacted] weeks gestation and if you do not have high blood pressure Tylenol Vicks Vaporub Zinc lozenges Zyrtec   Constipation: Colace Ducolax suppositories Fleet enema Glycerin suppositories Metamucil Milk of magnesia Miralax Senokot Smooth move tea  Diarrhea: Kaopectate Imodium A-D  *NO pepto Bismol  Hemorrhoids: Anusol Anusol HC Preparation H Tucks  Indigestion: Tums Maalox Mylanta Zantac  Pepcid  Insomnia: Benadryl (alcohol free) 25mg  every 6 hours as needed Tylenol PM Unisom, no Gelcaps  Leg Cramps: Tums MagGel  Nausea/Vomiting:   Bonine Dramamine Emetrol Ginger extract Sea bands Meclizine  Nausea medication to take during pregnancy:  Unisom (doxylamine succinate 25 mg tablets) Take one tablet daily at bedtime. If symptoms are not adequately controlled, the dose can be increased to a maximum recommended dose of two tablets daily (1/2 tablet in the morning, 1/2 tablet mid-afternoon and one at bedtime). Vitamin B6 100mg  tablets. Take one tablet twice a day (up to 200 mg per day).  Skin Rashes: Aveeno products Benadryl cream or 25mg  every 6 hours as needed Calamine Lotion 1% cortisone cream  Yeast infection: Gyne-lotrimin 7 Monistat 7   **If taking multiple medications, please check labels to avoid duplicating the same active ingredients **take medication as directed on the label ** Do not exceed 4000 mg of tylenol in 24 hours **Do not take medications that contain aspirin or ibuprofen

## 2020-04-28 NOTE — Telephone Encounter (Signed)
F/u    The patient calling back to speak with triage nurse regarding appointment with MD

## 2020-04-28 NOTE — MAU Provider Note (Signed)
History     CSN: [id number]  Arrival date and time: 04/28/20 1219   First Provider Initiated Contact with Patient 04/28/20 1413      Chief Complaint  Patient presents with  . Abdominal Pain   Samantha Herring is a 29 y.o. G1P0010 at Unknown who presents to MAU for ectopic evaluation. Patient was sent by GYN office for abdominal pain, that patient reports is not present any longer. Patient denies bleeding at this time as well. Patient has been having serial quants in her office to evaluate the status of her early pregnancy. Patient is followed by [facility]  Patient's husband present for entire visit.   OB History    Gravida  1   Para  0   Term  0   Preterm  0   AB  1   Living  0     SAB  0   TAB  0   Ectopic  0   Multiple  0   Live Births  0           Past Medical History:  Diagnosis Date  . Anxiety   . Depression   . Migraine headache with aura    since 6th grade  . TMJ arthralgia     No past surgical history on file.  Family History  Problem Relation Age of Onset  . Cancer Mother        lymphoma  . Hypertension Mother   . Stroke Sister 70        estrogen related  . Heart disease Maternal Grandmother   . Hyperlipidemia Maternal Grandmother   . Stroke Maternal Grandmother   . Alzheimer's disease Maternal Grandmother   . Cancer Maternal Grandfather   . Multiple myeloma Maternal Grandfather   . Heart disease Paternal Grandmother   . Heart disease Paternal Grandfather     Social History   Tobacco Use  . Smoking status: Never Smoker  . Smokeless tobacco: Never Used  Vaping Use  . Vaping Use: Never used  Substance Use Topics  . Alcohol use: Yes    Alcohol/week: 1.0 standard drink    Types: 1 Standard drinks or equivalent per week  . Drug use: No    Allergies: No Known Allergies  No medications prior to admission.    Review of Systems  Constitutional: Negative for chills, diaphoresis, fatigue and fever.   Eyes: Negative for visual disturbance.  Respiratory: Negative for shortness of breath.   Cardiovascular: Negative for chest pain.  Gastrointestinal: Negative for abdominal pain, constipation, diarrhea, nausea and vomiting.  Genitourinary: Negative for dysuria, flank pain, frequency, pelvic pain, urgency, vaginal bleeding and vaginal discharge.  Neurological: Negative for dizziness, weakness, light-headedness and headaches.   Physical Exam   Blood pressure 138/89, pulse 93, temperature 98.5 F (36.9 C), resp. rate 18, height 5' 9"  (1.753 m), weight 128.8 kg.  Patient Vitals for the past 24 hrs:  BP Temp Pulse Resp Height Weight  04/28/20 1252 138/89 98.5 F (36.9 C) 93 18 5' 9"  (1.753 m) 128.8 kg   Physical Exam Vitals and nursing note reviewed.  Constitutional:      General: She is not in acute distress.    Appearance: Normal appearance. She is not ill-appearing, toxic-appearing or diaphoretic.  HENT:     Head: Normocephalic and atraumatic.  Pulmonary:     Effort: Pulmonary effort is normal.  Neurological:     Mental Status: She is alert and oriented to person, place,  and time.  Psychiatric:        Mood and Affect: Mood normal.        Behavior: Behavior normal.        Thought Content: Thought content normal.        Judgment: Judgment normal.    Results for orders placed or performed during the hospital encounter of 04/28/20 (from the past 24 hour(s))  Wet prep, genital     Status: Abnormal   Collection Time: 04/28/20 12:59 PM   Specimen: Vaginal  Result Value Ref Range   Yeast Wet Prep HPF POC NONE SEEN NONE SEEN   Trich, Wet Prep NONE SEEN NONE SEEN   Clue Cells Wet Prep HPF POC NONE SEEN NONE SEEN   WBC, Wet Prep HPF POC MANY (A) NONE SEEN   Sperm NONE SEEN   Urinalysis, Routine w reflex microscopic Urine, Clean Catch     Status: Abnormal   Collection Time: 04/28/20 12:59 PM  Result Value Ref Range   Color, Urine YELLOW YELLOW   APPearance HAZY (A) CLEAR    Specific Gravity, Urine 1.020 1.005 - 1.030   pH 7.0 5.0 - 8.0   Glucose, UA NEGATIVE NEGATIVE mg/dL   Hgb urine dipstick NEGATIVE NEGATIVE   Bilirubin Urine NEGATIVE NEGATIVE   Ketones, ur NEGATIVE NEGATIVE mg/dL   Protein, ur NEGATIVE NEGATIVE mg/dL   Nitrite NEGATIVE NEGATIVE   Leukocytes,Ua SMALL (A) NEGATIVE   RBC / HPF 0-5 0 - 5 RBC/hpf   WBC, UA 0-5 0 - 5 WBC/hpf   Bacteria, UA FEW (A) NONE SEEN   Squamous Epithelial / LPF 6-10 0 - 5   Mucus PRESENT   CBC     Status: Abnormal   Collection Time: 04/28/20  1:02 PM  Result Value Ref Range   WBC 11.2 (H) 4.0 - 10.5 K/uL   RBC 5.48 (H) 3.87 - 5.11 MIL/uL   Hemoglobin 15.4 (H) 12.0 - 15.0 g/dL   HCT 46.1 (H) 36 - 46 %   MCV 84.1 80.0 - 100.0 fL   MCH 28.1 26.0 - 34.0 pg   MCHC 33.4 30.0 - 36.0 g/dL   RDW 12.6 11.5 - 15.5 %   Platelets 320 150 - 400 K/uL   nRBC 0.0 0.0 - 0.2 %  Comprehensive metabolic panel     Status: Abnormal   Collection Time: 04/28/20  1:02 PM  Result Value Ref Range   Sodium 140 135 - 145 mmol/L   Potassium 3.8 3.5 - 5.1 mmol/L   Chloride 107 98 - 111 mmol/L   CO2 23 22 - 32 mmol/L   Glucose, Bld 104 (H) 70 - 99 mg/dL   BUN 10 6 - 20 mg/dL   Creatinine, Ser 0.83 0.44 - 1.00 mg/dL   Calcium 9.2 8.9 - 10.3 mg/dL   Total Protein 7.6 6.5 - 8.1 g/dL   Albumin 4.1 3.5 - 5.0 g/dL   AST 22 15 - 41 U/L   ALT 26 0 - 44 U/L   Alkaline Phosphatase 75 38 - 126 U/L   Total Bilirubin 0.7 0.3 - 1.2 mg/dL   GFR, Estimated >60 >60 mL/min   Anion gap 10 5 - 15  hCG, quantitative, pregnancy     Status: Abnormal   Collection Time: 04/28/20  1:02 PM  Result Value Ref Range   hCG, Beta Chain, Quant, S 109 (H) <5 mIU/mL   US OB LESS THAN 14 WEEKS WITH OB TRANSVAGINAL  Result Date: 04/28/2020 CLINICAL DATA:  Right  lower quadrant pain EXAM: OBSTETRIC <14 WK [place] AND TRANSVAGINAL OB US TECHNIQUE: Both transabdominal and transvaginal ultrasound examinations were performed for complete evaluation of the gestation as well  as the maternal uterus, adnexal regions, and pelvic cul-de-sac. Transvaginal technique was performed to assess early pregnancy. COMPARISON:  None. FINDINGS: Intrauterine gestational sac: None Yolk sac:  Not Visualized. Embryo:  Not Visualized. Cardiac Activity: Not Visualized. Subchorionic hemorrhage:  Not applicable. Maternal uterus/adnexae: Unremarkable.  No free fluid. IMPRESSION: Pregnancy of unknown location.  No free fluid. Electronically Signed   By: Macy Mis M.D.   On: 04/28/2020 14:07    MAU Course  Procedures  MDM -r/o ectopic -UA: hazy/sm leuks/rare bacteria, sending urine for culture -CBC: H/H 15.4/46.1 -CMP: WNL -[place]: PUL, no free fluid -hCG: 109 -ABO: AB Positive -WetPrep: WNL -GC/CT collected -consulted with Dr. Harolyn Rutherford re: abnormal quants at outpatient office, pt OK to be discharged home with precautions and follow-up at outpatient office -Discussed with client the diagnosis of pregnancy of unknown anatomic location.  Three possibilities of outcome are: a healthy pregnancy that is too early to see a yolk sac to confirm the pregnancy is in the uterus, a pregnancy that is not healthy and has not developed and will not develop, and an ectopic pregnancy that is in the abdomen that cannot be identified at this time.  And ectopic pregnancy can be a life threatening situation as a pregnancy needs to be in the uterus which is a muscle and can stretch to accommodate the growth of a pregnancy.  Other structures in the pelvis and abdomen as not muscular and do not stretch with the growth of a pregnancy.  Worst case scenario is that a structure ruptures with a growing pregnancy not in the uterus and and internal hemorrhage can be a life threatening situation.  We need to follow the progression of this pregnancy carefully.  We need to check another serum pregnancy hormone level to determine if the levels are rising appropriately  and to determine the next steps that are needed for you.  Patient's questions were answered. -pt discharged to home in stable condition  Orders Placed This Encounter  Procedures  . Wet prep, genital    Standing Status:   Standing    Number of Occurrences:   1  . US OB LESS THAN 14 WEEKS WITH OB TRANSVAGINAL    Standing Status:   Standing    Number of Occurrences:   1    Order Specific Question:   Symptom/Reason for Exam    Answer:   Abdominal pain affecting pregnancy [[id number]]  . CBC    Standing Status:   Standing    Number of Occurrences:   1  . Comprehensive metabolic panel    Standing Status:   Standing    Number of Occurrences:   1  . hCG, quantitative, pregnancy    Standing Status:   Standing    Number of Occurrences:   1  . Urinalysis, Routine w reflex microscopic Urine, Clean Catch    Standing Status:   Standing    Number of Occurrences:   1  . Discharge patient    Order Specific Question:   Discharge disposition    Answer:   01-Home or Self Care [1]    Order Specific Question:   Discharge patient date    Answer:   04/28/2020   No orders of the defined types were placed in this encounter.   Assessment and Plan   1. Pregnancy  of unknown anatomic location   2. Abdominal pain affecting pregnancy   3. Blood type, Rh positive     Allergies as of 04/28/2020   No Known Allergies     Medication List    You have not been prescribed any medications.     -will call with culture results, if positive -safe meds in pregnancy list given -discussed ectopic vs. SAB vs. miscarriage -strict ectopic precautions given -return MAU precautions -f/u on 04/30/2020 at 1PM for repeat hCG in office -message sent to Dr. Sumner Boast to alert of results from MAU visit and need for f/u -pt discharged to home in stable condition  Elmyra Ricks E Latish Toutant 04/28/2020, 3:12 PM

## 2020-04-28 NOTE — Telephone Encounter (Signed)
Dr. Oscar La -please review MAU report and advise on f/u. PUS completed on 04/28/20.  04/28/20 beta hcg 109.  Patient is scheduled for beta hcg on 10/20 and PUS on 10/21.

## 2020-04-28 NOTE — Telephone Encounter (Signed)
Spoke with patient, advised per Dr. Oscar La.  10/20 lab appt and 10/21 PUS cancelled.  Lab appt scheduled for 10/21 at 9:05am.  Ectopic precautions reviewed.  Patient verbalizes understanding and is agreeable.  Encounter closed.

## 2020-04-28 NOTE — Telephone Encounter (Signed)
Since she had an ultrasound and BhcG today, I don't think she needs another BhcG until Thursday. She doesn't need another ultrasound yet. Lets change her BhcG from tomorrow to Thursday morning. She has been counseled with ectopic precautions and needs to be seen with any pain.

## 2020-04-29 ENCOUNTER — Other Ambulatory Visit: Payer: Self-pay

## 2020-04-29 LAB — GC/CHLAMYDIA PROBE AMP (~~LOC~~) NOT AT ARMC
Chlamydia: NEGATIVE
Comment: NEGATIVE
Comment: NORMAL
Neisseria Gonorrhea: NEGATIVE

## 2020-04-30 ENCOUNTER — Other Ambulatory Visit: Payer: Self-pay | Admitting: Obstetrics and Gynecology

## 2020-04-30 ENCOUNTER — Other Ambulatory Visit: Payer: Self-pay

## 2020-04-30 ENCOUNTER — Other Ambulatory Visit (INDEPENDENT_AMBULATORY_CARE_PROVIDER_SITE_OTHER): Payer: BC Managed Care – PPO

## 2020-04-30 DIAGNOSIS — O209 Hemorrhage in early pregnancy, unspecified: Secondary | ICD-10-CM

## 2020-04-30 DIAGNOSIS — O0281 Inappropriate change in quantitative human chorionic gonadotropin (hCG) in early pregnancy: Secondary | ICD-10-CM

## 2020-04-30 DIAGNOSIS — N926 Irregular menstruation, unspecified: Secondary | ICD-10-CM

## 2020-04-30 LAB — BETA HCG QUANT (REF LAB): hCG Quant: 103 m[IU]/mL

## 2020-04-30 NOTE — Progress Notes (Signed)
Stat Beta Hcg orders placed per Dr Oscar La.  Pt has lab visit on 10/22 at 925 am.  Encounter closed

## 2020-05-01 ENCOUNTER — Other Ambulatory Visit (INDEPENDENT_AMBULATORY_CARE_PROVIDER_SITE_OTHER): Payer: BC Managed Care – PPO

## 2020-05-01 ENCOUNTER — Other Ambulatory Visit: Payer: Self-pay | Admitting: *Deleted

## 2020-05-01 ENCOUNTER — Telehealth: Payer: Self-pay | Admitting: *Deleted

## 2020-05-01 DIAGNOSIS — O0281 Inappropriate change in quantitative human chorionic gonadotropin (hCG) in early pregnancy: Secondary | ICD-10-CM

## 2020-05-01 DIAGNOSIS — N926 Irregular menstruation, unspecified: Secondary | ICD-10-CM

## 2020-05-01 LAB — BETA HCG QUANT (REF LAB): hCG Quant: 91 m[IU]/mL

## 2020-05-01 NOTE — Telephone Encounter (Signed)
Patient notified of results, see result note.   Encounter closed.

## 2020-05-01 NOTE — Telephone Encounter (Signed)
STAT beta HCG: 91   Routing to Dr. Oscar La

## 2020-05-01 NOTE — Telephone Encounter (Signed)
See result note.  

## 2020-05-04 ENCOUNTER — Other Ambulatory Visit (INDEPENDENT_AMBULATORY_CARE_PROVIDER_SITE_OTHER): Payer: BC Managed Care – PPO

## 2020-05-04 ENCOUNTER — Other Ambulatory Visit: Payer: Self-pay

## 2020-05-04 ENCOUNTER — Other Ambulatory Visit: Payer: Self-pay | Admitting: *Deleted

## 2020-05-04 ENCOUNTER — Telehealth: Payer: Self-pay | Admitting: *Deleted

## 2020-05-04 DIAGNOSIS — O0281 Inappropriate change in quantitative human chorionic gonadotropin (hCG) in early pregnancy: Secondary | ICD-10-CM

## 2020-05-04 LAB — BETA HCG QUANT (REF LAB): hCG Quant: 59 m[IU]/mL

## 2020-05-04 NOTE — Telephone Encounter (Signed)
Leda Min, RN  05/04/2020 2:03 PM EDT Back to Top    Spoke with patient, advised as seen below per Dr. Oscar La.  Lab appt scheduled for 05/06/20 at 9am. Future lab order placed.  Patient denies vaginal bleeding. Reports mild, intermittent "menses like" cramps.  MAU precautions reviewed for severe pain or heavy bleeding.  Patient verbalizes understanding and is agreeable.   Routing to Dr. Shirley Friar.      See result note dated 05/04/20.   Encounter closed.

## 2020-05-04 NOTE — Telephone Encounter (Signed)
STAT beta Hcg: 59  Routing to Dr. Oscar La to review and advise.

## 2020-05-06 ENCOUNTER — Telehealth: Payer: Self-pay | Admitting: *Deleted

## 2020-05-06 ENCOUNTER — Other Ambulatory Visit (INDEPENDENT_AMBULATORY_CARE_PROVIDER_SITE_OTHER): Payer: BC Managed Care – PPO

## 2020-05-06 ENCOUNTER — Other Ambulatory Visit: Payer: Self-pay

## 2020-05-06 DIAGNOSIS — O0281 Inappropriate change in quantitative human chorionic gonadotropin (hCG) in early pregnancy: Secondary | ICD-10-CM

## 2020-05-06 DIAGNOSIS — O039 Complete or unspecified spontaneous abortion without complication: Secondary | ICD-10-CM

## 2020-05-06 LAB — BETA HCG QUANT (REF LAB): hCG Quant: 48 m[IU]/mL

## 2020-05-06 NOTE — Telephone Encounter (Signed)
05/06/20 STAT beta Hcg reviewed with Dr. Oscar La.   Call placed to patient. Advised Beta Hcg 48, repeat on 05/08/20.  Patient denies vaginal bleeding, pain, tenderness, fever/chills.  MAU precautions reviewed.  Lab appt scheduled for 10/29 at 9:25am. Patient verbalizes understanding and is agreeable.   Routing to provider for final review. Patient is agreeable to disposition. Will close encounter.

## 2020-05-08 ENCOUNTER — Other Ambulatory Visit: Payer: Self-pay

## 2020-05-08 ENCOUNTER — Other Ambulatory Visit (INDEPENDENT_AMBULATORY_CARE_PROVIDER_SITE_OTHER): Payer: BC Managed Care – PPO

## 2020-05-08 DIAGNOSIS — O0281 Inappropriate change in quantitative human chorionic gonadotropin (hCG) in early pregnancy: Secondary | ICD-10-CM

## 2020-05-08 LAB — BETA HCG QUANT (REF LAB): hCG Quant: 44 m[IU]/mL

## 2020-05-11 ENCOUNTER — Telehealth: Payer: Self-pay

## 2020-05-11 ENCOUNTER — Encounter: Payer: Self-pay | Admitting: Obstetrics and Gynecology

## 2020-05-11 ENCOUNTER — Other Ambulatory Visit: Payer: Self-pay

## 2020-05-11 ENCOUNTER — Ambulatory Visit: Payer: BC Managed Care – PPO | Admitting: Obstetrics and Gynecology

## 2020-05-11 ENCOUNTER — Other Ambulatory Visit (INDEPENDENT_AMBULATORY_CARE_PROVIDER_SITE_OTHER): Payer: BC Managed Care – PPO

## 2020-05-11 ENCOUNTER — Other Ambulatory Visit: Payer: Self-pay | Admitting: Obstetrics and Gynecology

## 2020-05-11 VITALS — BP 116/68 | HR 95 | Ht 69.0 in | Wt 286.0 lb

## 2020-05-11 DIAGNOSIS — O0281 Inappropriate change in quantitative human chorionic gonadotropin (hCG) in early pregnancy: Secondary | ICD-10-CM

## 2020-05-11 LAB — COMPREHENSIVE METABOLIC PANEL
ALT: 17 IU/L (ref 0–32)
AST: 17 IU/L (ref 0–40)
Albumin/Globulin Ratio: 1.8 (ref 1.2–2.2)
Albumin: 4.2 g/dL (ref 3.8–4.8)
Alkaline Phosphatase: 85 IU/L (ref 44–121)
BUN/Creatinine Ratio: 13 (ref 9–23)
BUN: 9 mg/dL (ref 6–20)
Bilirubin Total: 0.3 mg/dL (ref 0.0–1.2)
CO2: 24 mmol/L (ref 20–29)
Calcium: 9.1 mg/dL (ref 8.7–10.2)
Chloride: 106 mmol/L (ref 96–106)
Creatinine, Ser: 0.7 mg/dL (ref 0.57–1.00)
GFR calc Af Amer: 134 mL/min/{1.73_m2} (ref >59)
GFR calc non Af Amer: 116 mL/min/{1.73_m2} (ref >59)
Globulin, Total: 2.4 g/dL (ref 1.5–4.5)
Glucose: 86 mg/dL (ref 65–99)
Potassium: 4.3 mmol/L (ref 3.5–5.2)
Sodium: 138 mmol/L (ref 134–144)
Total Protein: 6.6 g/dL (ref 6.0–8.5)

## 2020-05-11 LAB — CBC
Hematocrit: 43.3 % (ref 34.0–46.6)
Hemoglobin: 14.7 g/dL (ref 11.1–15.9)
MCH: 28.3 pg (ref 26.6–33.0)
MCHC: 33.9 g/dL (ref 31.5–35.7)
MCV: 83 fL (ref 79–97)
Platelets: 300 10*3/uL (ref 150–450)
RBC: 5.2 x10E6/uL (ref 3.77–5.28)
RDW: 13.7 % (ref 11.7–15.4)
WBC: 8.5 10*3/uL (ref 3.4–10.8)

## 2020-05-11 LAB — BETA HCG QUANT (REF LAB): hCG Quant: 36 m[IU]/mL

## 2020-05-11 NOTE — Telephone Encounter (Signed)
Spoke with pt. Pt calling to give update to Dr Oscar La. Pt was seen today for OV for inappropriate change in beta hcg. Pt states after got home from OV, pt states started having light bleeding with light cramps. Pt advised to monitor bleeding by using pads during this cycle. Advised on pad count and heavy bleeding protocol. Pt advised if has soaking bleeding <1 hr or large clots, severe abd pain to be seen at MAU. Advised can take Motrin 800 mg every 8 hours as needed for cramps/pain.  Pt agreeable and verbalized understanding.   Routing to Dr Oscar La for update. Please advise if any further recommendations.

## 2020-05-11 NOTE — Telephone Encounter (Signed)
I agree with your recommendations. Planning BhcG on Wednesday. This is good news.

## 2020-05-11 NOTE — Progress Notes (Unsigned)
STAT CBC and CMP orders placed per Dr Oscar La.

## 2020-05-11 NOTE — Telephone Encounter (Signed)
-----   Message from Romualdo Bolk, MD sent at 05/10/2020  4:31 PM EDT ----- Regarding: RE: HCG Can you please come her get a stat BhcG prior to her appointment. I really need the # to make any recommendations.  Thanks, Noreene Larsson ----- Message ----- From: Isabell Jarvis, RN Sent: 05/08/2020   4:29 PM EDT To: Jerene Bears, MD, Romualdo Bolk, MD Subject: RE: HCG                                        Spoke with pt. Pt coming for OV on 11/1 at 130 pm with Dr Oscar La.  Pt advised  ER and ectopic precautions.   Thanks,  Judeth Cornfield  ----- Message ----- From: Jerene Bears, MD Sent: 05/08/2020   4:22 PM EDT To: Romualdo Bolk, MD, # Subject: HCG                                            Please let pt know her HCG is 44.  It is still coming down but slowly.  Please make sure she has appt with Dr. Oscar La on Monday or Tuesday if possible and give precautions for going to the ER with possible ectopic.  I think Dr. Oscar La will repeat her HCG again and if stops going down or starts going up, will offer different treatment.    CC:  Dr. Oscar La

## 2020-05-11 NOTE — Progress Notes (Signed)
GYNECOLOGY  VISIT   HPI: 29 y.o.   Married White or Caucasian Not Hispanic or Latino  female   G1P0010 with No LMP recorded.   here for follow up on inappropriate change in quantitative HCG in early pregnancy. She has had first trimester bleeding with an abnormal rise, now slow decline in BhcG.  10/21: 103 10/22: 91 10/25: 59 10/27: 48 10/29: 44 11/1: 36  She hasn't had any bleeding since 04/13/20, she did have one day of heavy bleeding on 04/11/20. She still has intermittent mild cramps, no change. No longer having nausea. Still with tender nipples.   GYNECOLOGIC HISTORY: No LMP recorded. Contraception:none  Menopausal hormone therapy: none         OB History    Gravida  1   Para  0   Term  0   Preterm  0   AB  1   Living  0     SAB  0   TAB  0   Ectopic  0   Multiple  0   Live Births  0              Patient Active Problem List   Diagnosis Date Noted  . History of pyelonephritis 03/16/2018  . Morbid obesity ([place]) 05/10/2016  . TMJ arthralgia   . Migraine headache with aura     Past Medical History:  Diagnosis Date  . Anxiety   . Depression   . Migraine headache with aura    since 6th grade  . TMJ arthralgia     No past surgical history on file.  No current outpatient medications on file.   No current facility-administered medications for this visit.     ALLERGIES: Patient has no known allergies.  Family History  Problem Relation Age of Onset  . Cancer Mother        lymphoma  . Hypertension Mother   . Stroke Sister 81        estrogen related  . Heart disease Maternal Grandmother   . Hyperlipidemia Maternal Grandmother   . Stroke Maternal Grandmother   . Alzheimer's disease Maternal Grandmother   . Cancer Maternal Grandfather   . Multiple myeloma Maternal Grandfather   . Heart disease Paternal Grandmother   . Heart disease Paternal Grandfather     Social History   Socioeconomic History  . Marital status: Married    Spouse  name: Not on file  . Number of children: Not on file  . Years of education: Not on file  . Highest education level: Not on file  Occupational History  . Not on file  Tobacco Use  . Smoking status: Never Smoker  . Smokeless tobacco: Never Used  Vaping Use  . Vaping Use: Never used  Substance and Sexual Activity  . Alcohol use: Yes    Alcohol/week: 1.0 standard drink    Types: 1 Standard drinks or equivalent per week  . Drug use: No  . Sexual activity: Yes    Birth control/protection: I.U.D.    Comment: Paragard inserted 2015  Other Topics Concern  . Not on file  Social History Narrative  . Not on file   Social Determinants of Health   Financial Resource Strain:   . Difficulty of Paying Living Expenses: Not on file  Food Insecurity:   . Worried About Charity fundraiser in the Last Year: Not on file  . Ran Out of Food in the Last Year: Not on file  Transportation Needs:   .  Lack of Transportation (Medical): Not on file  . Lack of Transportation (Non-Medical): Not on file  Physical Activity:   . Days of Exercise per Week: Not on file  . Minutes of Exercise per Session: Not on file  Stress:   . Feeling of Stress : Not on file  Social Connections:   . Frequency of Communication with Friends and Family: Not on file  . Frequency of Social Gatherings with Friends and Family: Not on file  . Attends Religious Services: Not on file  . Active Member of Clubs or Organizations: Not on file  . Attends Archivist Meetings: Not on file  . Marital Status: Not on file  Intimate Partner Violence:   . Fear of Current or Ex-Partner: Not on file  . Emotionally Abused: Not on file  . Physically Abused: Not on file  . Sexually Abused: Not on file    Review of Systems  All other systems reviewed and are negative.   PHYSICAL EXAMINATION:    BP 116/68   Pulse 95   Ht _0  (1.753 m)   Wt 286 lb (129.7 kg)   SpO2 100%   BMI 42.23 kg/m     General appearance: alert,  cooperative and appears stated age  ASSESSMENT Abnormally rising now decreasing BhcG's, her current level is 29 which is more than a 10% drop from 3 days ago. No significant pain, no bleeding.     PLAN Recommend continued surveillance for now Needs to be seen with any significant pain If her BhcG were to plateau we discussed D&C vs methotrexate (labs have been drawn) BT AB+   ~20 minutes in total patient care

## 2020-05-11 NOTE — Progress Notes (Signed)
STAT beta hcg per Dr Oscar La. Orders placed.  Encounter closed

## 2020-05-11 NOTE — Telephone Encounter (Signed)
Spoke with pt. Pt agreeable to come for beta Hcg today per Dr Salli Quarry recommendations.  Denies any vaginal bleeding or cramps.

## 2020-05-11 NOTE — Telephone Encounter (Signed)
Encounter closed

## 2020-05-11 NOTE — Telephone Encounter (Signed)
Reviewed with Dr Oscar La. Add STAT CBC and CMP to orders today for future plan of care. Pt agreeable and verbalized understanding. Spoke with pt while here for labs. Pt states would like to keep OV with Dr Oscar La today to discuss plan of care. Per Lucy in lab, results should be back by 1:30 pm appointment. Pt agreeable and verbalized understanding.  Routing to Dr Oscar La for review Encounter closed.  Pt has OV today at 1:30 pm.

## 2020-05-13 ENCOUNTER — Other Ambulatory Visit (HOSPITAL_COMMUNITY)
Admission: RE | Admit: 2020-05-13 | Discharge: 2020-05-13 | Disposition: A | Discharge status: Home / Self Care | Payer: BC Managed Care – PPO | Source: Ambulatory Visit | Attending: Obstetrics and Gynecology | Admitting: Obstetrics and Gynecology

## 2020-05-13 ENCOUNTER — Other Ambulatory Visit: Payer: Self-pay | Admitting: *Deleted

## 2020-05-13 ENCOUNTER — Other Ambulatory Visit: Payer: Self-pay

## 2020-05-13 ENCOUNTER — Ambulatory Visit: Payer: BC Managed Care – PPO | Admitting: Obstetrics and Gynecology

## 2020-05-13 ENCOUNTER — Encounter: Payer: Self-pay | Admitting: Obstetrics and Gynecology

## 2020-05-13 ENCOUNTER — Other Ambulatory Visit (INDEPENDENT_AMBULATORY_CARE_PROVIDER_SITE_OTHER): Payer: BC Managed Care – PPO

## 2020-05-13 VITALS — BP 140/68 | HR 68 | Ht 69.0 in | Wt 286.0 lb

## 2020-05-13 DIAGNOSIS — O3680X0 Pregnancy with inconclusive fetal viability, not applicable or unspecified: Secondary | ICD-10-CM | POA: Insufficient documentation

## 2020-05-13 DIAGNOSIS — O0281 Inappropriate change in quantitative human chorionic gonadotropin (hCG) in early pregnancy: Secondary | ICD-10-CM

## 2020-05-13 LAB — BETA HCG QUANT (REF LAB): hCG Quant: 58 m[IU]/mL

## 2020-05-13 NOTE — Patient Instructions (Signed)
Methotrexate Treatment for an Ectopic Pregnancy, Care After This sheet gives you information about how to care for yourself after your procedure. Your health care provider may also give you more specific instructions. If you have problems or questions, contact your health care provider. What can I expect after the procedure? After the procedure, it is common to have:  Abdominal cramping.  Vaginal bleeding.  Fatigue.  Nausea.  Vomiting.  Diarrhea. Blood tests will be taken at timed intervals for several days or weeks to check your pregnancy hormone levels. The blood tests will be done until the pregnancy hormone can no longer be detected in the blood. Follow these instructions at home: Activity  Do not have sex until your health care provider approves.  Limit activities that take a lot of effort as told by your health care provider. Medicines  Take over the counter and prescription medicines only as told by your health care provider.  Do not take aspirin, ibuprofen, naproxen, or any other NSAIDs.  Do not take folic acid, prenatal vitamins, or other vitamins that contain folic acid. General instructions   Do not drink alcohol.  Follow instructions from your health care provider on how and when to report any symptoms that may indicate a ruptured ectopic pregnancy.  Keep all follow-up visits as told by your health care provider. This is important. Contact a health care provider if:  You have persistent nausea and vomiting.  You have persistent diarrhea.  You are having a reaction to the medicine, such as: ? Tiredness. ? Skin rash. ? Hair loss. Get help right away if:  Your abdominal or pelvic pain gets worse.  You have more vaginal bleeding.  You feel light-headed or you faint.  You have shortness of breath.  Your heart rate increases.  You develop a cough.  You have chills.  You have a fever. Summary  After the procedure, it is common to have symptoms  of abdominal cramping, vaginal bleeding and fatigue. You may also experience other symptoms.  Blood tests will be taken at timed intervals for several days or weeks to check your pregnancy hormone levels. The blood tests will be done until the pregnancy hormone can no longer be detected in the blood.  Limit strenuous activity as told by your health care provider.  Follow instructions from your health care provider on how and when to report any symptoms that may indicate a ruptured ectopic pregnancy. This information is not intended to replace advice given to you by your health care provider. Make sure you discuss any questions you have with your health care provider. Document Revised: 06/09/2017 Document Reviewed: 08/16/2016 Elsevier Patient Education  2020 [employer]. Miscarriage A miscarriage is the loss of an unborn baby (fetus) before the 20th week of pregnancy. Most miscarriages happen during the first 3 months of pregnancy. Sometimes, a miscarriage can happen before a woman knows that she is pregnant. Having a miscarriage can be an emotional experience. If you have had a miscarriage, talk with your health care provider about any questions you may have about miscarrying, the grieving process, and your plans for future pregnancy. What are the causes? A miscarriage may be caused by:  Problems with the genes or chromosomes of the fetus. These problems make it impossible for the baby to develop normally. They are often the result of random errors that occur early in the development of the baby, and are not passed from parent to child (not inherited).  Infection of the cervix or  uterus.  Conditions that affect hormone balance in the body.  Problems with the cervix, such as the cervix opening and thinning before pregnancy is at term (cervical insufficiency).  Problems with the uterus. These may include: ? A uterus with an abnormal shape. ? Fibroids in the uterus. ? Congenital  abnormalities. These are problems that were present at birth.  Certain medical conditions.  Smoking, drinking alcohol, or using drugs.  Injury (trauma). In many cases, the cause of a miscarriage is not known. What are the signs or symptoms? Symptoms of this condition include:  Vaginal bleeding or spotting, with or without cramps or pain.  Pain or cramping in the abdomen or lower back.  Passing fluid, tissue, or blood clots from the vagina. How is this diagnosed? This condition may be diagnosed based on:  A physical exam.  Ultrasound.  Blood tests.  Urine tests. How is this treated? Treatment for a miscarriage is sometimes not necessary if you naturally pass all the tissue that was in your uterus. If necessary, this condition may be treated with:  Dilation and curettage (D&C). This is a procedure in which the cervix is stretched open and the lining of the uterus (endometrium) is scraped. This is done only if tissue from the fetus or placenta remains in the body (incomplete miscarriage).  Medicines, such as: ? Antibiotic medicine, to treat infection. ? Medicine to help the body pass any remaining tissue. ? Medicine to reduce (contract) the size of the uterus. These medicines may be given if you have a lot of bleeding. If you have Rh negative blood and your baby was Rh positive, you will need a shot of a medicine called Rh immunoglobulinto protect your future babies from Rh blood problems. "Rh-negative" and "Rh-positive" refer to whether or not the blood has a specific protein found on the surface of red blood cells (Rh factor). Follow these instructions at home: Medicines   Take over-the-counter and prescription medicines only as told by your health care provider.  If you were prescribed antibiotic medicine, take it as told by your health care provider. Do not stop taking the antibiotic even if you start to feel better.  Do not take NSAIDs, such as aspirin and ibuprofen,  unless they are approved by your health care provider. These medicines can cause bleeding. Activity  Rest as directed. Ask your health care provider what activities are safe for you.  Have someone help with home and family responsibilities during this time. General instructions  Keep track of the number of sanitary pads you use each day and how soaked (saturated) they are. Write down this information.  Monitor the amount of tissue or blood clots that you pass from your vagina. Save any large amounts of tissue for your health care provider to examine.  Do not use tampons, douche, or have sex until your health care provider approves.  To help you and your partner with the process of grieving, talk with your health care provider or seek counseling.  When you are ready, meet with your health care provider to discuss any important steps you should take for your health. Also, discuss steps you should take to have a healthy pregnancy in the future.  Keep all follow-up visits as told by your health care provider. This is important. Where to find more information  The American Congress of Obstetricians and Gynecologists: www.acog.org  U.S. Department of Health and Cytogeneticist of Women's Health: http://hoffman.com/ Contact a health care provider if:  You  have a fever or chills.  You have a foul smelling vaginal discharge.  You have more bleeding instead of less. Get help right away if:  You have severe cramps or pain in your back or abdomen.  You pass blood clots or tissue from your vagina that is walnut-sized or larger.  You soak more than 1 regular sanitary pad in an hour.  You become light-headed or weak.  You pass out.  You have feelings of sadness that take over your thoughts, or you have thoughts of hurting yourself. Summary  Most miscarriages happen in the first 3 months of pregnancy. Sometimes miscarriage happens before a woman even knows that she is  pregnant.  Follow your health care provider's instruction for home care. Keep all follow-up appointments.  To help you and your partner with the process of grieving, talk with your health care provider or seek counseling. This information is not intended to replace advice given to you by your health care provider. Make sure you discuss any questions you have with your health care provider. Document Revised: 10/19/2018 Document Reviewed: 08/02/2016 Elsevier Patient Education  2020 [employer].

## 2020-05-13 NOTE — Progress Notes (Signed)
GYNECOLOGY  VISIT   HPI: 29 y.o.   Married White or Caucasian Not Hispanic or Latino  female   G1P0010 with Patient's last menstrual period was 05/11/2020.   here for pregnancy of unknown origin. She started bleeding on Monday, moderate flow like a normal cycle yesterday, a lighter today. At the most saturating a pad in 6 hours. She had menstrual like cramps yesterday, no pain today. She has been followed with abnormally rising then falling BhcG's. No findings on ultrasound on 10/19 (hcg dropped after this) 10/4: 6  10/13: 29 10/15: 40 10/18: 73 10/19: 109 10/21: 103 10/22: 91 10/25: 59 10/27: 48 10/29: 44 11/1: 36 11/3: 58  GYNECOLOGIC HISTORY: Patient's last menstrual period was 05/11/2020. Contraception:none  Menopausal hormone therapy: none         OB History    Gravida  1   Para  0   Term  0   Preterm  0   AB  1   Living  0     SAB  0   TAB  0   Ectopic  0   Multiple  0   Live Births  0              Patient Active Problem List   Diagnosis Date Noted  . History of pyelonephritis 03/16/2018  . Morbid obesity ([place]) 05/10/2016  . TMJ arthralgia   . Migraine headache with aura     Past Medical History:  Diagnosis Date  . Anxiety   . Depression   . Migraine headache with aura    since 6th grade  . TMJ arthralgia     No past surgical history on file.  No current outpatient medications on file.   No current facility-administered medications for this visit.     ALLERGIES: Patient has no known allergies.  Family History  Problem Relation Age of Onset  . Cancer Mother        lymphoma  . Hypertension Mother   . Stroke Sister 62        estrogen related  . Heart disease Maternal Grandmother   . Hyperlipidemia Maternal Grandmother   . Stroke Maternal Grandmother   . Alzheimer's disease Maternal Grandmother   . Cancer Maternal Grandfather   . Multiple myeloma Maternal Grandfather   . Heart disease Paternal Grandmother   . Heart  disease Paternal Grandfather     Social History   Socioeconomic History  . Marital status: Married    Spouse name: Not on file  . Number of children: Not on file  . Years of education: Not on file  . Highest education level: Not on file  Occupational History  . Not on file  Tobacco Use  . Smoking status: Never Smoker  . Smokeless tobacco: Never Used  Vaping Use  . Vaping Use: Never used  Substance and Sexual Activity  . Alcohol use: Yes    Alcohol/week: 1.0 standard drink    Types: 1 Standard drinks or equivalent per week  . Drug use: No  . Sexual activity: Yes    Birth control/protection: I.U.D.    Comment: Paragard inserted 2015  Other Topics Concern  . Not on file  Social History Narrative  . Not on file   Social Determinants of Health   Financial Resource Strain:   . Difficulty of Paying Living Expenses: Not on file  Food Insecurity:   . Worried About Charity fundraiser in the Last Year: Not on file  . Ran  Out of Food in the Last Year: Not on file  Transportation Needs:   . Lack of Transportation (Medical): Not on file  . Lack of Transportation (Non-Medical): Not on file  Physical Activity:   . Days of Exercise per Week: Not on file  . Minutes of Exercise per Session: Not on file  Stress:   . Feeling of Stress : Not on file  Social Connections:   . Frequency of Communication with Friends and Family: Not on file  . Frequency of Social Gatherings with Friends and Family: Not on file  . Attends Religious Services: Not on file  . Active Member of Clubs or Organizations: Not on file  . Attends Archivist Meetings: Not on file  . Marital Status: Not on file  Intimate Partner Violence:   . Fear of Current or Ex-Partner: Not on file  . Emotionally Abused: Not on file  . Physically Abused: Not on file  . Sexually Abused: Not on file    Review of Systems  All other systems reviewed and are negative.   PHYSICAL EXAMINATION:    BP 140/68   Pulse  68   Ht 5' 9"  (1.753 m)   Wt 286 lb (129.7 kg)   LMP 05/11/2020   SpO2 100%   BMI 42.23 kg/m     General appearance: alert, cooperative and appears stated age   Pelvic: External genitalia:  no lesions              Urethra:  normal appearing urethra with no masses, tenderness or lesions              Bartholins and Skenes: normal                 Vagina: normal appearing vagina with normal color and discharge, no lesions, moderate blood in the vagina              Cervix:no lesions  The risks of uterine evacuation were reviewed and a consent was obtained.  A speculum was placed in the vagina and the cervix was cleansed with betadine. A tenaculum was placed on the cervix and the uterine evacuator was placed into the endometrial cavity. The uterus sounded to 7-8 cm. The endometrial curettage was performed, taking care to get a representative sample, sampling 360 degrees of the uterine cavity. The cavity felt gritty at the end of the procedure. Moderate tissue was obtained. The tenaculum and speculum were removed. There were no complications.    Chaperone was present for exam.  ASSESSMENT Pregnancy of unknown location Abnormal rise, fall, then rise of BhcG BT AB+    PLAN Uterine evacuation performed Return tomorrow morning for stat BhcG Discussed possible need for methotrexate   In addition to the uterine evacuation, ~20 minutes was spent in total patient care

## 2020-05-14 ENCOUNTER — Other Ambulatory Visit: Payer: Self-pay | Admitting: *Deleted

## 2020-05-14 ENCOUNTER — Inpatient Hospital Stay (HOSPITAL_COMMUNITY)
Admission: AD | Admit: 2020-05-14 | Discharge: 2020-05-14 | Disposition: A | Discharge status: Home / Self Care | Payer: BC Managed Care – PPO | Attending: Obstetrics and Gynecology | Admitting: Obstetrics and Gynecology

## 2020-05-14 ENCOUNTER — Encounter: Payer: Self-pay | Admitting: Obstetrics and Gynecology

## 2020-05-14 ENCOUNTER — Other Ambulatory Visit: Payer: Self-pay | Admitting: Obstetrics and Gynecology

## 2020-05-14 ENCOUNTER — Other Ambulatory Visit: Payer: Self-pay

## 2020-05-14 ENCOUNTER — Other Ambulatory Visit (INDEPENDENT_AMBULATORY_CARE_PROVIDER_SITE_OTHER): Payer: BC Managed Care – PPO

## 2020-05-14 DIAGNOSIS — O0281 Inappropriate change in quantitative human chorionic gonadotropin (hCG) in early pregnancy: Secondary | ICD-10-CM

## 2020-05-14 DIAGNOSIS — O3680X0 Pregnancy with inconclusive fetal viability, not applicable or unspecified: Secondary | ICD-10-CM

## 2020-05-14 DIAGNOSIS — O009 Unspecified ectopic pregnancy without intrauterine pregnancy: Secondary | ICD-10-CM

## 2020-05-14 LAB — COMPREHENSIVE METABOLIC PANEL
ALT: 19 U/L (ref 0–44)
AST: 18 U/L (ref 15–41)
Albumin: 3.8 g/dL (ref 3.5–5.0)
Alkaline Phosphatase: 67 U/L (ref 38–126)
Anion gap: 11 (ref 5–15)
BUN: 7 mg/dL (ref 6–20)
CO2: 23 mmol/L (ref 22–32)
Calcium: 9.2 mg/dL (ref 8.9–10.3)
Chloride: 106 mmol/L (ref 98–111)
Creatinine, Ser: 0.76 mg/dL (ref 0.44–1.00)
GFR, Estimated: >60 mL/min (ref >60)
Glucose, Bld: 92 mg/dL (ref 70–99)
Potassium: 4.2 mmol/L (ref 3.5–5.1)
Sodium: 140 mmol/L (ref 135–145)
Total Bilirubin: 0.5 mg/dL (ref 0.3–1.2)
Total Protein: 7 g/dL (ref 6.5–8.1)

## 2020-05-14 LAB — HCG, QUANTITATIVE, PREGNANCY: hCG, Beta Chain, Quant, S: 91 m[IU]/mL — ABNORMAL HIGH

## 2020-05-14 LAB — CBC
HCT: 43.9 % (ref 36.0–46.0)
Hemoglobin: 14.5 g/dL (ref 12.0–15.0)
MCH: 27.9 pg (ref 26.0–34.0)
MCHC: 33 g/dL (ref 30.0–36.0)
MCV: 84.4 fL (ref 80.0–100.0)
Platelets: 317 10*3/uL (ref 150–400)
RBC: 5.2 MIL/uL — ABNORMAL HIGH (ref 3.87–5.11)
RDW: 12.5 % (ref 11.5–15.5)
WBC: 8.6 10*3/uL (ref 4.0–10.5)
nRBC: 0 % (ref 0.0–0.2)

## 2020-05-14 LAB — SURGICAL PATHOLOGY

## 2020-05-14 LAB — BETA HCG QUANT (REF LAB): hCG Quant: 76 m[IU]/mL

## 2020-05-14 MED ORDER — METHOTREXATE FOR ECTOPIC PREGNANCY
50.0000 mg/m2 | Freq: Once | INTRAMUSCULAR | Status: AC
  Administered 2020-05-14: 17:00:00 125 mg via INTRAMUSCULAR
  Filled 2020-05-14: qty 1

## 2020-05-14 NOTE — Progress Notes (Signed)
Erroneous encounter

## 2020-05-14 NOTE — MAU Note (Cosign Needed)
Patient Samantha Herring is a 29 y.o. G1P0010  at Unknown here for mtx s/p abnormal rise in quants at [facility] (gyn office). Patient had D and C yesterday; quant at [facility] was 9 yesterday.    Patient tolerated MTX treatment well; reviewed bleeding signs and signs of ectopic rupture, patient and husband verbalized understanding and patient will return on Sunday morning for follow up bHCG.   Patient Vitals for the past 24 hrs:  BP Temp Pulse Resp Height Weight  05/14/20 1258 (!) 141/82 98.4 F (36.9 C) 89 18 5\' 9"  (1.753 m) 130.6 kg    

## 2020-05-14 NOTE — Discharge Instructions (Signed)
-  return to MAU on Sunday morning, 11/7 for follow up hormone levels   Methotrexate Treatment for an Ectopic Pregnancy, Care After This sheet gives you information about how to care for yourself after your procedure. Your health care provider may also give you more specific instructions. If you have problems or questions, contact your health care provider. What can I expect after the procedure? After the procedure, it is common to have:  Abdominal cramping.  Vaginal bleeding.  Fatigue.  Nausea.  Vomiting.  Diarrhea. Blood tests will be taken at timed intervals for several days or weeks to check your pregnancy hormone levels. The blood tests will be done until the pregnancy hormone can no longer be detected in the blood. Follow these instructions at home: Activity  Do not have sex until your health care provider approves.  Limit activities that take a lot of effort as told by your health care provider. Medicines  Take over the counter and prescription medicines only as told by your health care provider.  Do not take aspirin, ibuprofen, naproxen, or any other NSAIDs.  Do not take folic acid, prenatal vitamins, or other vitamins that contain folic acid. General instructions   Do not drink alcohol.  Follow instructions from your health care provider on how and when to report any symptoms that may indicate a ruptured ectopic pregnancy.  Keep all follow-up visits as told by your health care provider. This is important. Contact a health care provider if:  You have persistent nausea and vomiting.  You have persistent diarrhea.  You are having a reaction to the medicine, such as: ? Tiredness. ? Skin rash. ? Hair loss. Get help right away if:  Your abdominal or pelvic pain gets worse.  You have more vaginal bleeding.  You feel light-headed or you faint.  You have shortness of breath.  Your heart rate increases.  You develop a cough.  You have chills.  You have  a fever. Summary  After the procedure, it is common to have symptoms of abdominal cramping, vaginal bleeding and fatigue. You may also experience other symptoms.  Blood tests will be taken at timed intervals for several days or weeks to check your pregnancy hormone levels. The blood tests will be done until the pregnancy hormone can no longer be detected in the blood.  Limit strenuous activity as told by your health care provider.  Follow instructions from your health care provider on how and when to report any symptoms that may indicate a ruptured ectopic pregnancy. This information is not intended to replace advice given to you by your health care provider. Make sure you discuss any questions you have with your health care provider. Document Revised: 06/09/2017 Document Reviewed: 08/16/2016 Elsevier Patient Education  2020 [employer].

## 2020-05-14 NOTE — MAU Note (Signed)
Attempted to call phleb, no answer

## 2020-05-15 ENCOUNTER — Other Ambulatory Visit: Payer: Self-pay

## 2020-05-15 DIAGNOSIS — O3680X0 Pregnancy with inconclusive fetal viability, not applicable or unspecified: Secondary | ICD-10-CM

## 2020-05-15 DIAGNOSIS — O039 Complete or unspecified spontaneous abortion without complication: Secondary | ICD-10-CM

## 2020-05-15 NOTE — Progress Notes (Signed)
Orders placed for CBC, CMP and STAT Beta Hcg per Dr Oscar La as 7 day labs following Methotrexate injection on 05/14/20. Encounter closed

## 2020-05-17 ENCOUNTER — Other Ambulatory Visit: Payer: Self-pay

## 2020-05-17 ENCOUNTER — Inpatient Hospital Stay (HOSPITAL_COMMUNITY)
Admission: AD | Admit: 2020-05-17 | Discharge: 2020-05-17 | Disposition: A | Discharge status: Home / Self Care | Payer: BC Managed Care – PPO | Attending: Obstetrics & Gynecology | Admitting: Obstetrics & Gynecology

## 2020-05-17 DIAGNOSIS — O3680X0 Pregnancy with inconclusive fetal viability, not applicable or unspecified: Secondary | ICD-10-CM | POA: Diagnosis not present

## 2020-05-17 DIAGNOSIS — O209 Hemorrhage in early pregnancy, unspecified: Secondary | ICD-10-CM | POA: Insufficient documentation

## 2020-05-17 LAB — HCG, QUANTITATIVE, PREGNANCY: hCG, Beta Chain, Quant, S: 79 m[IU]/mL — ABNORMAL HIGH (ref <5)

## 2020-05-17 NOTE — MAU Note (Signed)
Samantha Herring is a 29 y.o. here in MAU reporting: here for follow up hcg, day 4 post MTX. Having brown spotting and some right sided abdominal pain.  Onset of complaint: ongoing  Pain score: 2/10  Vitals:   05/17/20 0946  BP: 118/70  Pulse: 83  Resp: 16  Temp: 98 F (36.7 C)  SpO2: 98%     Lab orders placed from triage: hcg

## 2020-05-17 NOTE — MAU Provider Note (Signed)
History   Chief Complaint:  Follow-up   Samantha Herring is  29 y.o. G1P0010 Patient's last menstrual period was 05/11/2020.Marland Kitchen Patient is here for day 4 HCG s/p methotrexate for suspected ectopic pregnancy.   Since her last visit, the patient is without new complaint. The patient reports bleeding as  Brown spotting & 2/10 cramping in RLQ.   General ROS:  negative  Her previous Quantitative HCG values are:   Component     Latest Ref Rng & Units 05/14/2020  HCG, Beta Chain, Quant, S     <5 mIU/mL 91 (H)    Physical Exam   Blood pressure 118/70, pulse 83, temperature 98 F (36.7 C), temperature source Oral, resp. rate 16, last menstrual period 05/11/2020, SpO2 98 %.  Physical Examination: General appearance - alert, well appearing, and in no distress Mental status - alert, oriented to person, place, and time Chest - respirations unlabored   Labs: Results for orders placed or performed during the hospital encounter of 05/17/20 (from the past 24 hour(s))  hCG, quantitative, pregnancy   Collection Time: 05/17/20 10:02 AM  Result Value Ref Range   hCG, Beta Chain, Quant, S 79 (H) <5 mIU/mL    Assessment:   1. Pregnancy of unknown anatomic location       Plan: -Discharge home in stable condition -ectopic precautions discussed -Patient scheduled for day 7 labs at [facility] on Wednesday  Judeth Horn, NP 05/17/2020, 11:18 AM

## 2020-05-17 NOTE — Discharge Instructions (Signed)
Return to care  If you have heavier bleeding that soaks through more that 2 pads per hour for an hour or more If you bleed so much that you feel like you might pass out or you do pass out If you have significant abdominal pain that is not improved with Tylenol   

## 2020-05-18 ENCOUNTER — Telehealth: Payer: Self-pay | Admitting: Obstetrics and Gynecology

## 2020-05-18 NOTE — Telephone Encounter (Signed)
Please let the patient know that her BhcG went from 91 to 79 and see how she is feeling. Make sure she is set up for her 7 day f/u BhcG.

## 2020-05-18 NOTE — Telephone Encounter (Signed)
Spoke with pt. Pt given results from Beta Hcg on 11/7. Pt states feeling tired and fatigued, but overall good. Pt states working from home today which is helping her relax more as needed. Pt states had light pink spotting on Friday night through Sat and only light brown/tan spotting on Sunday. None today.  Pt states taking OTC Tylenol as needed for some mild discomfort on right side of pelvis. Describes as only "twinges" and rates as 1-2 on scale. Tylenol resolves this discomfort.  Advised will give update to Dr Oscar La and return call with any additional recommendations. Pt agreeable.  Pt has 7 day Beta Hcg level scheduled for 11/10 at 845am. Pt aware and agreeable.   Routing to Dr Oscar La, please advise

## 2020-05-19 NOTE — Telephone Encounter (Signed)
As long as her pain is mild I wouldn't do anything different. She should call with any concerns and f/u for her BhcG tomorrow.

## 2020-05-20 ENCOUNTER — Other Ambulatory Visit: Payer: Self-pay

## 2020-05-20 ENCOUNTER — Other Ambulatory Visit (INDEPENDENT_AMBULATORY_CARE_PROVIDER_SITE_OTHER): Payer: BC Managed Care – PPO

## 2020-05-20 DIAGNOSIS — O039 Complete or unspecified spontaneous abortion without complication: Secondary | ICD-10-CM

## 2020-05-20 DIAGNOSIS — O3680X0 Pregnancy with inconclusive fetal viability, not applicable or unspecified: Secondary | ICD-10-CM

## 2020-05-20 LAB — CBC
Hematocrit: 42.2 % (ref 34.0–46.6)
Hemoglobin: 14.2 g/dL (ref 11.1–15.9)
MCH: 28.4 pg (ref 26.6–33.0)
MCHC: 33.6 g/dL (ref 31.5–35.7)
MCV: 84 fL (ref 79–97)
Platelets: 268 10*3/uL (ref 150–450)
RBC: 5 x10E6/uL (ref 3.77–5.28)
RDW: 12.7 % (ref 11.7–15.4)
WBC: 8.3 10*3/uL (ref 3.4–10.8)

## 2020-05-20 LAB — COMPREHENSIVE METABOLIC PANEL
ALT: 18 IU/L (ref 0–32)
AST: 15 IU/L (ref 0–40)
Albumin/Globulin Ratio: 1.5 (ref 1.2–2.2)
Albumin: 4 g/dL (ref 3.8–4.8)
Alkaline Phosphatase: 81 IU/L (ref 44–121)
BUN/Creatinine Ratio: 14 (ref 9–23)
BUN: 10 mg/dL (ref 6–20)
Bilirubin Total: 0.4 mg/dL (ref 0.0–1.2)
CO2: 21 mmol/L (ref 20–29)
Calcium: 8.8 mg/dL (ref 8.7–10.2)
Chloride: 105 mmol/L (ref 96–106)
Creatinine, Ser: 0.69 mg/dL (ref 0.57–1.00)
GFR calc Af Amer: 134 mL/min/{1.73_m2} (ref >59)
GFR calc non Af Amer: 116 mL/min/{1.73_m2} (ref >59)
Globulin, Total: 2.7 g/dL (ref 1.5–4.5)
Glucose: 83 mg/dL (ref 65–99)
Potassium: 4.2 mmol/L (ref 3.5–5.2)
Sodium: 138 mmol/L (ref 134–144)
Total Protein: 6.7 g/dL (ref 6.0–8.5)

## 2020-05-20 LAB — BETA HCG QUANT (REF LAB): hCG Quant: 42 m[IU]/mL

## 2020-05-26 ENCOUNTER — Other Ambulatory Visit: Payer: Self-pay

## 2020-05-26 DIAGNOSIS — O039 Complete or unspecified spontaneous abortion without complication: Secondary | ICD-10-CM

## 2020-05-26 NOTE — Progress Notes (Signed)
Stat Beta Hcg per Dr Oscar La.  Encounter closed

## 2020-05-27 ENCOUNTER — Other Ambulatory Visit: Payer: Self-pay

## 2020-05-27 ENCOUNTER — Other Ambulatory Visit (INDEPENDENT_AMBULATORY_CARE_PROVIDER_SITE_OTHER): Payer: BC Managed Care – PPO

## 2020-05-27 ENCOUNTER — Telehealth: Payer: Self-pay | Admitting: *Deleted

## 2020-05-27 DIAGNOSIS — O039 Complete or unspecified spontaneous abortion without complication: Secondary | ICD-10-CM

## 2020-05-27 LAB — BETA HCG QUANT (REF LAB): hCG Quant: 4 m[IU]/mL

## 2020-05-27 NOTE — Telephone Encounter (Signed)
Call to patient. Patient advised of negative beta HCG and patient verbalized understanding. Patient advised no longer need lab appointments. Patient asking for appointment on 06-02-20 to be cancelled.   Patient asking if she is okay to resume exercise and intercourse?   Also asking if it is okay for her to receive her covid and flu vaccines on Friday? Wanted to make sure there was enough time between the Methotrexate injection and getting covid and flu vaccines.   RN advised would review with Dr. Oscar La and return call with recommendations. Patient agreeable.

## 2020-05-28 NOTE — Telephone Encounter (Signed)
Pt given update and recommendations per Dr Oscar La. Pt ok to resume normal activities. Per UTP, ok to receive flu and covid vaccines tomorrow since has been 2 weeks since Methotrexate injection. Pt verbalized understanding and is thankful for all the help through this time.  Pt to return call to office with next +UPT for follow up and monitoring. Pt agreeable.  Encounter closed.   Samantha Bolk, MD  P [facility] Triage Pool Negative, no f/u blood work needed. No clear guidelines for how long to wait after treatment with methotrexate prior to getting pregnant. Up to date recommends 3 months, but no reports of increased risk of birth defects in patient who conceived sooner. She should be on folic acid.

## 2020-06-02 ENCOUNTER — Other Ambulatory Visit: Payer: Self-pay

## 2020-06-07 ENCOUNTER — Encounter: Payer: Self-pay | Admitting: Emergency Medicine

## 2020-06-07 ENCOUNTER — Other Ambulatory Visit: Payer: Self-pay

## 2020-06-07 ENCOUNTER — Ambulatory Visit
Admission: EM | Admit: 2020-06-07 | Discharge: 2020-06-07 | Disposition: A | Discharge status: Home / Self Care | Payer: BC Managed Care – PPO | Attending: Emergency Medicine | Admitting: Emergency Medicine

## 2020-06-07 DIAGNOSIS — W5501XA Bitten by cat, initial encounter: Secondary | ICD-10-CM | POA: Diagnosis not present

## 2020-06-07 DIAGNOSIS — S61411A Laceration without foreign body of right hand, initial encounter: Secondary | ICD-10-CM

## 2020-06-07 HISTORY — DX: Unspecified ectopic pregnancy without intrauterine pregnancy: O00.90

## 2020-06-07 MED ORDER — AMOXICILLIN-POT CLAVULANATE 875-125 MG PO TABS: 1.0000 | ORAL_TABLET | Freq: Two times a day (BID) | ORAL | 0 refills | Status: AC

## 2020-06-07 NOTE — Discharge Instructions (Addendum)
Keep area(s) clean and dry. Take antibiotic as prescribed with food - important to complete course. Return for worsening pain, redness, swelling, discharge, fever. 

## 2020-06-07 NOTE — ED Provider Notes (Signed)
[facility]    CSN: [id number] Arrival date & time: 06/07/20  0801      History   Chief Complaint Chief Complaint  Patient presents with  . Animal Bite    HPI Samantha Herring is a 29 y.o. female  Presenting for cat bite that occurred this morning to right hand.  States cat is up-to-date on rabies vaccine, predominantly stays inside.  Fever, arthralgias, myalgias, malaise.  Past Medical History:  Diagnosis Date  . Anxiety   . Depression   . Ectopic pregnancy   . Migraine headache with aura    since 6th grade  . TMJ arthralgia     Patient Active Problem List   Diagnosis Date Noted  . History of pyelonephritis 03/16/2018  . Morbid obesity ([place]) 05/10/2016  . TMJ arthralgia   . Migraine headache with aura     History reviewed. No pertinent surgical history.  OB History    Gravida  1   Para  0   Term  0   Preterm  0   AB  1   Living  0     SAB  0   TAB  0   Ectopic  0   Multiple  0   Live Births  0            Home Medications    Prior to Admission medications   Medication Sig Start Date End Date Taking? Authorizing Provider  amoxicillin-clavulanate (AUGMENTIN) 875-125 MG tablet Take 1 tablet by mouth every 12 (twelve) hours. 06/07/20   Hall-Potvin, [place], PA-C    Family History Family History  Problem Relation Age of Onset  . Cancer Mother        lymphoma  . Hypertension Mother   . Stroke Sister 43        estrogen related  . Heart disease Maternal Grandmother   . Hyperlipidemia Maternal Grandmother   . Stroke Maternal Grandmother   . Alzheimer's disease Maternal Grandmother   . Cancer Maternal Grandfather   . Multiple myeloma Maternal Grandfather   . Heart disease Paternal Grandmother   . Heart disease Paternal Grandfather     Social History Social History   Tobacco Use  . Smoking status: Never Smoker  . Smokeless tobacco: Never Used  Vaping Use  . Vaping Use: Never used  Substance Use Topics  .  Alcohol use: Yes    Alcohol/week: 1.0 standard drink    Types: 1 Standard drinks or equivalent per week  . Drug use: No     Allergies   Patient has no known allergies.   Review of Systems Review of Systems  Constitutional: Negative for fatigue and fever.  HENT: Negative for ear pain, sinus pain, sore throat and voice change.   Eyes: Negative for pain, redness and visual disturbance.  Respiratory: Negative for cough and shortness of breath.   Cardiovascular: Negative for chest pain and palpitations.  Gastrointestinal: Negative for abdominal pain, diarrhea and vomiting.  Musculoskeletal: Negative for arthralgias and myalgias.  Skin: Positive for wound. Negative for rash.  Neurological: Negative for syncope and headaches.     Physical Exam Triage Vital Signs ED Triage Vitals [06/07/20 0814]  Enc Vitals Group     BP (!) 146/87     Pulse Rate 82     Resp 18     Temp 98 F (36.7 C)     Temp Source Oral     SpO2 97 %     Weight  Height      Head Circumference      Peak Flow      Pain Score 4     Pain Loc      Pain Edu?      Excl. in [place]?    No data found.  Updated Vital Signs BP (!) 146/87 (BP Location: Left Arm)   Pulse 82   Temp 98 F (36.7 C) (Oral)   Resp 18   LMP 05/11/2020   SpO2 97%   Visual Acuity Right Eye Distance:   Left Eye Distance:   Bilateral Distance:    Right Eye Near:   Left Eye Near:    Bilateral Near:     Physical Exam Constitutional:      General: She is not in acute distress. HENT:     Head: Normocephalic and atraumatic.  Eyes:     General: No scleral icterus.    Pupils: Pupils are equal, round, and reactive to light.  Cardiovascular:     Rate and Rhythm: Normal rate.  Pulmonary:     Effort: Pulmonary effort is normal.  Musculoskeletal:        General: No swelling or tenderness. Normal range of motion.  Skin:    Capillary Refill: Capillary refill takes less than 2 seconds.     Coloration: Skin is not jaundiced or pale.   Neurological:     Mental Status: She is alert and oriented to person, place, and time.     Comments: 2 punctate wounds to right hand, dorsal aspect      UC Treatments / Results  Labs (all labs ordered are listed, but only abnormal results are displayed) Labs Reviewed - No data to display  EKG   Radiology No results found.  Procedures Laceration Repair  Date/Time: 06/07/2020 8:46 AM Performed by: Quincy Sheehan, PA-C Authorized by: Quincy Sheehan, PA-C   Consent:    Consent obtained:  Verbal   Consent given by:  Patient   Risks discussed:  Infection, need for additional repair, pain, poor cosmetic result and poor wound healing   Alternatives discussed:  No treatment and delayed treatment Universal protocol:    Patient identity confirmed:  Verbally with patient Anesthesia (see MAR for exact dosages):    Anesthesia method:  None Laceration details:    Location:  Hand   Hand location:  R hand, dorsum   Wound length (cm): 0.2, 0.3.   Depth (mm):  3 Repair type:    Repair type:  Simple Pre-procedure details:    Preparation:  Patient was prepped and draped in usual sterile fashion Exploration:    Hemostasis achieved with:  Direct pressure   Wound exploration: wound explored through full range of motion     Contaminated: no   Treatment:    Area cleansed with:  Betadine Skin repair:    Repair method:  Steri-Strips   Number of Steri-Strips:  2 Approximation:    Approximation:  Close Post-procedure details:    Dressing:  Bulky dressing   Patient tolerance of procedure:  Tolerated well, no immediate complications   (including critical care time)  Medications Ordered in UC Medications - No data to display  Initial Impression / Assessment and Plan / UC Course  I have reviewed the triage vital signs and the nursing notes.  Pertinent labs & imaging results that were available during my care of the patient were reviewed by me and considered in my  medical decision making (see chart for details).  Afebrile, nontoxic in office today.  Cat up-to-date on rabies vaccine.  2 steri strips placed.Will cover with Augmentin given nature of injury.  Return precautions discussed, pt verbalized understanding and is agreeable to plan. Final Clinical Impressions(s) / UC Diagnoses   Final diagnoses:  Cat bite, initial encounter  Laceration of right hand without foreign body, initial encounter     Discharge Instructions     Keep area(s) clean and dry. Take antibiotic as prescribed with food - important to complete course. Return for worsening pain, redness, swelling, discharge, fever.    ED Prescriptions    Medication Sig Dispense Auth. Provider   amoxicillin-clavulanate (AUGMENTIN) 875-125 MG tablet Take 1 tablet by mouth every 12 (twelve) hours. 14 tablet Hall-Potvin, [place], PA-C     PDMP not reviewed this encounter.   Neldon Mc Harvest, [place] 06/07/20 [phone]

## 2020-06-07 NOTE — ED Triage Notes (Signed)
Pt here for cat bite to right hand; cat is up to date on rabies vaccine

## 2020-06-11 ENCOUNTER — Ambulatory Visit (INDEPENDENT_AMBULATORY_CARE_PROVIDER_SITE_OTHER): Payer: BC Managed Care – PPO | Admitting: Psychology

## 2020-06-11 DIAGNOSIS — F411 Generalized anxiety disorder: Secondary | ICD-10-CM

## 2020-06-25 ENCOUNTER — Ambulatory Visit (INDEPENDENT_AMBULATORY_CARE_PROVIDER_SITE_OTHER): Payer: BC Managed Care – PPO | Admitting: Psychology

## 2020-06-25 DIAGNOSIS — Z634 Disappearance and death of family member: Secondary | ICD-10-CM | POA: Diagnosis not present

## 2020-06-25 DIAGNOSIS — F411 Generalized anxiety disorder: Secondary | ICD-10-CM

## 2020-07-16 ENCOUNTER — Ambulatory Visit (INDEPENDENT_AMBULATORY_CARE_PROVIDER_SITE_OTHER): Payer: BC Managed Care – PPO | Admitting: Psychology

## 2020-07-16 DIAGNOSIS — F411 Generalized anxiety disorder: Secondary | ICD-10-CM | POA: Diagnosis not present

## 2020-07-21 ENCOUNTER — Encounter: Payer: Self-pay | Admitting: Obstetrics and Gynecology

## 2020-07-30 ENCOUNTER — Ambulatory Visit: Payer: BC Managed Care – PPO | Admitting: Psychology

## 2021-09-15 IMAGING — US US OB < 14 WEEKS - US OB TV
1 series · 15 of 28 positions shown · non-contrast
Comparison: None.

CLINICAL DATA: Right lower quadrant pain

EXAM:
OBSTETRIC <14 WK US AND TRANSVAGINAL OB US
TECHNIQUE: Both transabdominal and transvaginal ultrasound examinations were
performed for complete evaluation of the gestation as well as the
maternal uterus, adnexal regions, and pelvic cul-de-sac.
Transvaginal technique was performed to assess early pregnancy.

[Series 1: us ob < 14 weeks - us ob tv · 63 acquisitions, 15 frames shown]
[im 1/63]
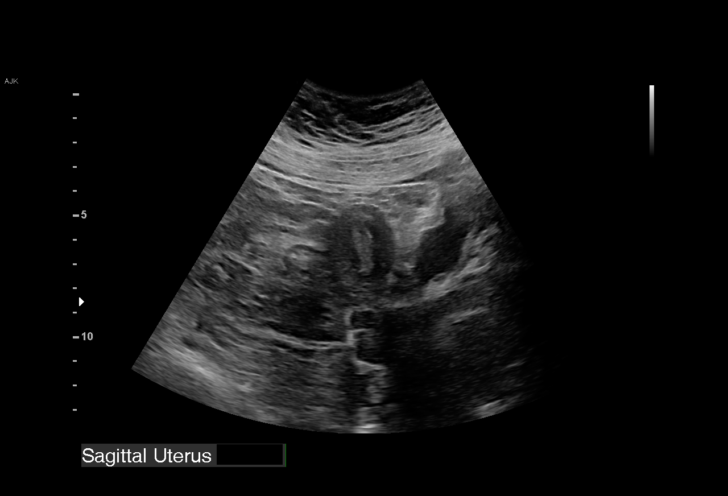
[im 5/63]
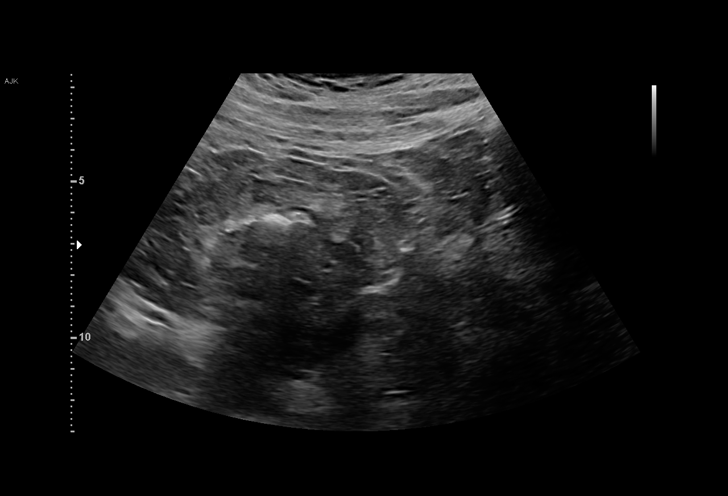
[im 10/63]
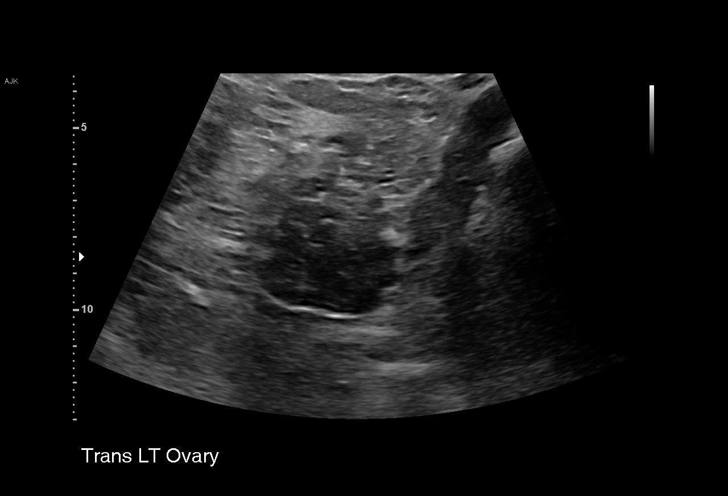
[im 14/63]
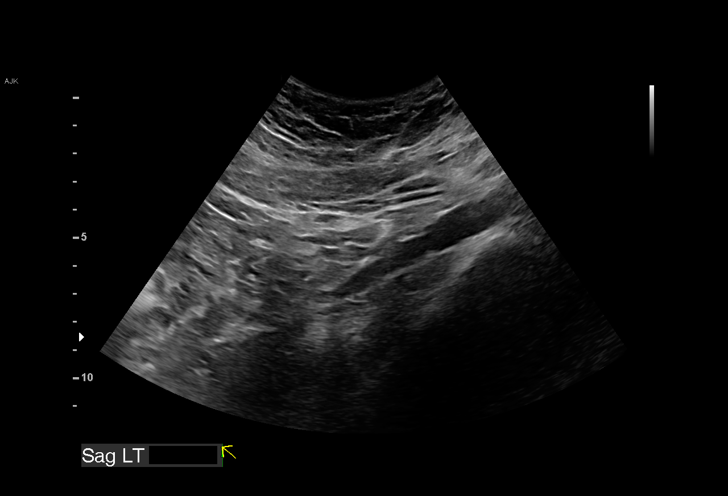
[im 19/63]
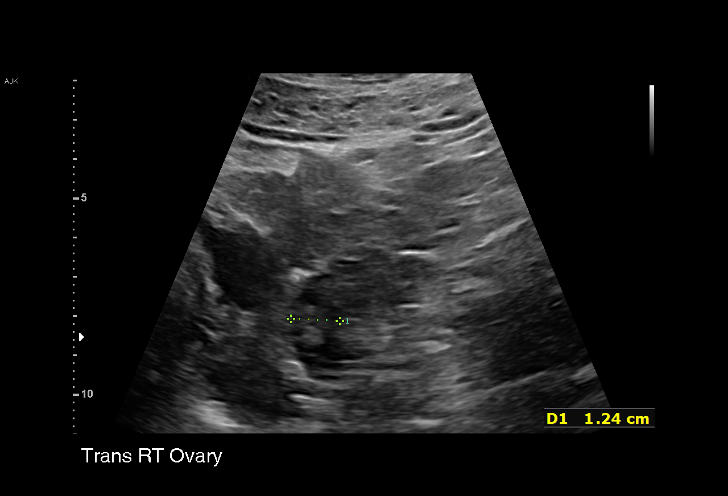
[im 23/63]
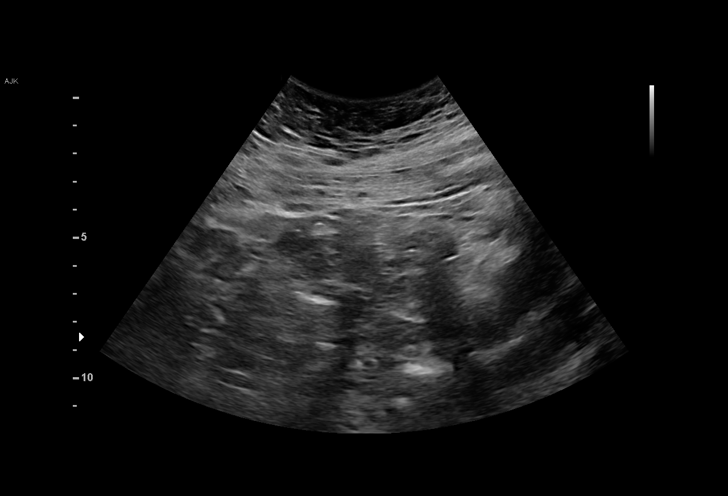
[im 28/63]
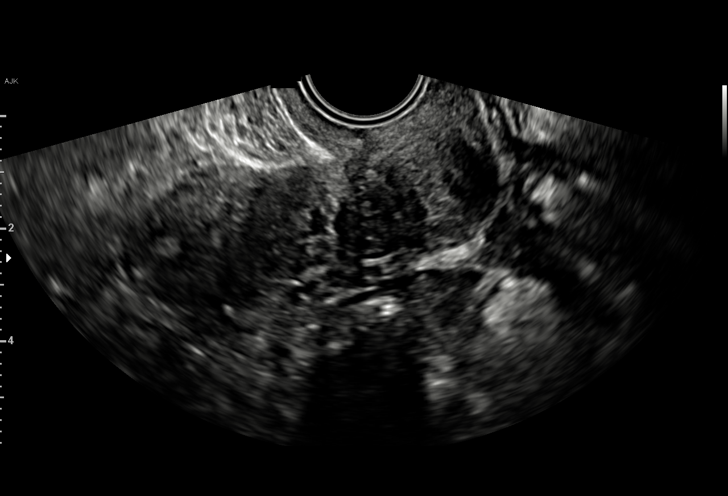
[im 33/63]
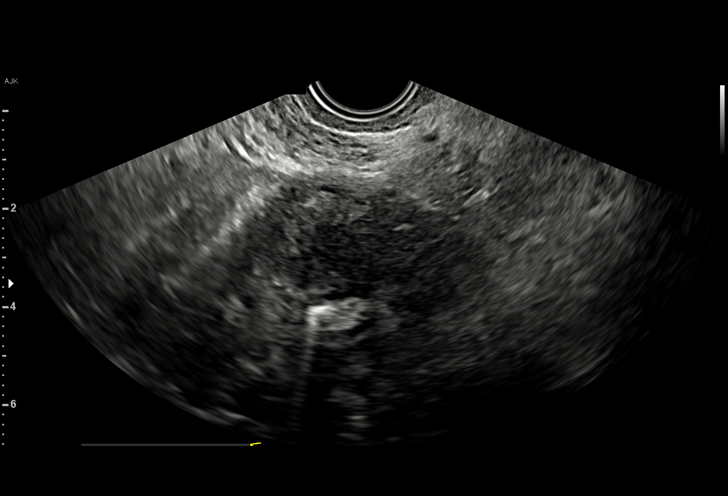
[im 35/63]
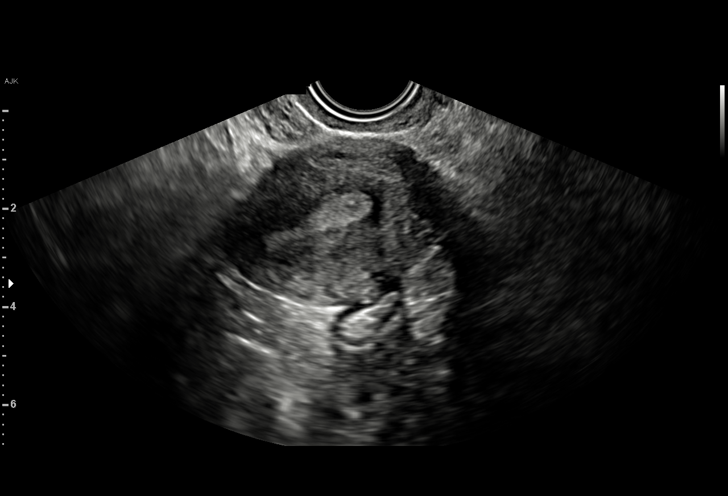
[im 40/63]
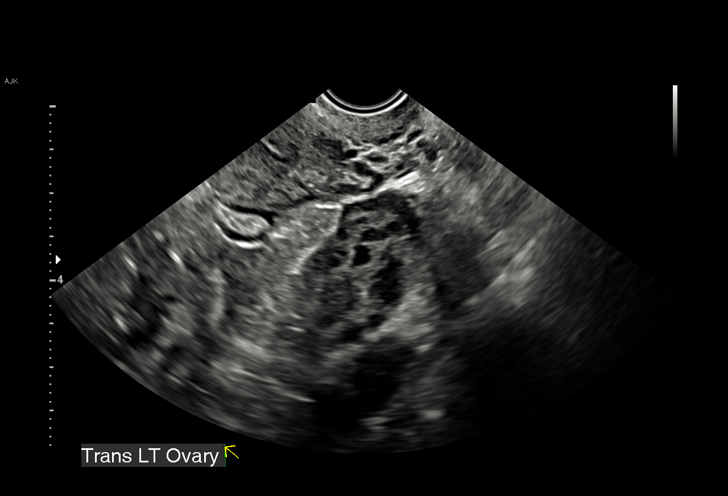
[im 44/63]
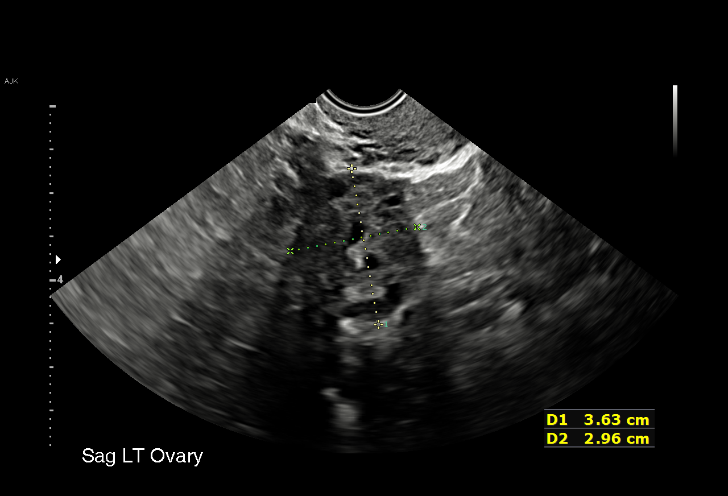
[im 49/63]
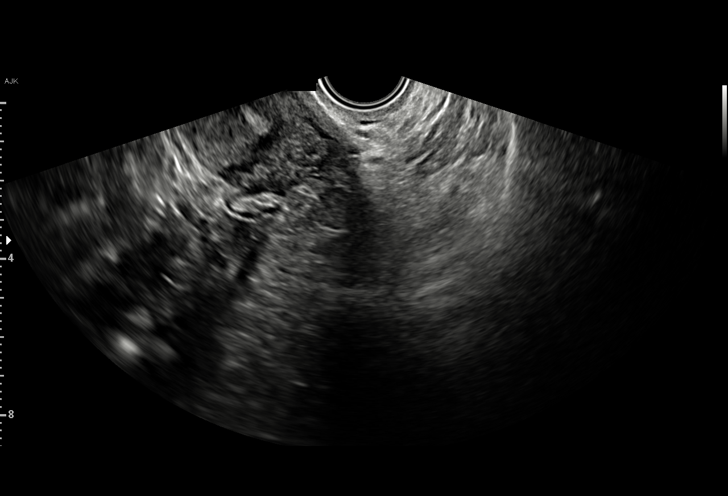
[im 53/63]
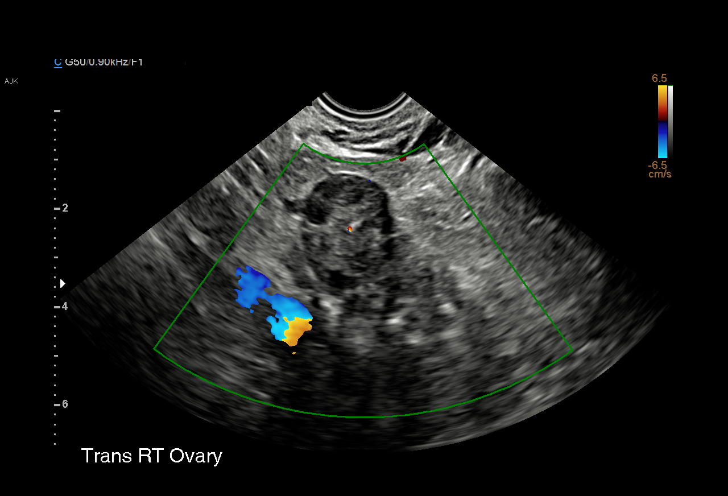
[im 58/63]
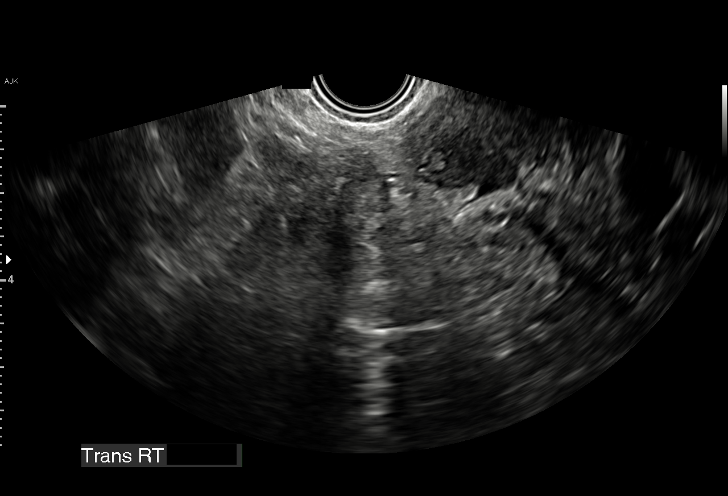
[im 63/63]
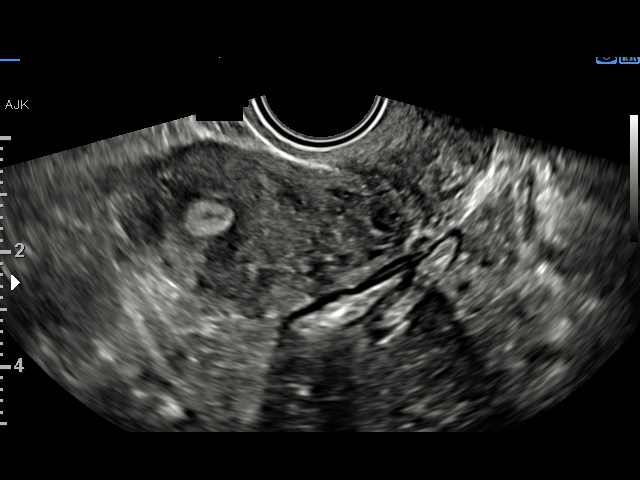

[15 of 28 positions shown; findings below may reference images not displayed]

FINDINGS: Intrauterine gestational sac: None

Yolk sac:  Not Visualized.

Embryo:  Not Visualized.

Cardiac Activity: Not Visualized.

Subchorionic hemorrhage:  Not applicable.

Maternal uterus/adnexae: Unremarkable.  No free fluid.
IMPRESSION: Pregnancy of unknown location.  No free fluid.

## 2022-05-25 ENCOUNTER — Encounter: Payer: Self-pay | Admitting: Internal Medicine
